# Patient Record
Sex: Female | Born: 1990 | Race: Black or African American | Hispanic: No | Marital: Single | Smoking: Former smoker
Health system: Southern US, Community
[De-identification: ages and names within clinical notes are randomized; demographics above are authoritative.]

## PROBLEM LIST (undated history)

## (undated) ENCOUNTER — Inpatient Hospital Stay (HOSPITAL_COMMUNITY): Payer: Self-pay

## (undated) DIAGNOSIS — D649 Anemia, unspecified: Secondary | ICD-10-CM

## (undated) DIAGNOSIS — A749 Chlamydial infection, unspecified: Secondary | ICD-10-CM

## (undated) HISTORY — PX: NO PAST SURGERIES: SHX2092

---
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
## 2011-09-27 ENCOUNTER — Inpatient Hospital Stay (HOSPITAL_COMMUNITY)
Admission: AD | Admit: 2011-09-27 | Discharge: 2011-09-30 | DRG: 775 | Disposition: A | Discharge status: Home / Self Care | Payer: Medicaid Other | Source: Ambulatory Visit | Attending: Obstetrics | Admitting: Obstetrics

## 2011-09-27 ENCOUNTER — Encounter (HOSPITAL_COMMUNITY): Payer: Self-pay | Admitting: *Deleted

## 2011-09-27 NOTE — Progress Notes (Signed)
Pt off monitor to BR and ambulate to rm 165

## 2011-09-27 NOTE — Progress Notes (Signed)
Pt states she has been contracting all evening and they are getting worse and worse

## 2011-09-28 ENCOUNTER — Encounter (HOSPITAL_COMMUNITY): Payer: Self-pay | Admitting: *Deleted

## 2011-09-28 ENCOUNTER — Encounter (HOSPITAL_COMMUNITY): Payer: Self-pay | Admitting: Anesthesiology

## 2011-09-28 ENCOUNTER — Inpatient Hospital Stay (HOSPITAL_COMMUNITY): Payer: Medicaid Other | Admitting: Anesthesiology

## 2011-09-28 LAB — ABO/RH: RH Type: POSITIVE

## 2011-09-28 LAB — CBC
HCT: 32.9 % — ABNORMAL LOW (ref 36.0–46.0)
Hemoglobin: 11.2 g/dL — ABNORMAL LOW (ref 12.0–15.0)
RBC: 3.75 MIL/uL — ABNORMAL LOW (ref 3.87–5.11)
WBC: 7.9 10*3/uL (ref 4.0–10.5)

## 2011-09-28 LAB — GC/CHLAMYDIA PROBE AMP, GENITAL
Chlamydia: NEGATIVE
Gonorrhea: NEGATIVE

## 2011-09-28 LAB — RPR: RPR: NONREACTIVE

## 2011-09-28 LAB — HIV ANTIBODY (ROUTINE TESTING W REFLEX): HIV: NONREACTIVE

## 2011-09-28 LAB — ANTIBODY SCREEN: Antibody Screen: NEGATIVE

## 2011-09-28 MED ORDER — IBUPROFEN 600 MG PO TABS
600.0000 mg | ORAL_TABLET | Freq: Four times a day (QID) | ORAL | Status: DC
  Administered 2011-09-28 – 2011-09-30 (×8): 600 mg via ORAL
  Filled 2011-09-28 (×7): qty 1

## 2011-09-28 MED ORDER — LIDOCAINE HCL 1.5 % IJ SOLN
INTRAMUSCULAR | Status: DC | PRN
  Administered 2011-09-28 (×2): 4 mL via EPIDURAL

## 2011-09-28 MED ORDER — SIMETHICONE 80 MG PO CHEW: 80.0000 mg | CHEWABLE_TABLET | ORAL | Status: DC | PRN

## 2011-09-28 MED ORDER — WITCH HAZEL-GLYCERIN EX PADS: 1.0000 "application " | MEDICATED_PAD | CUTANEOUS | Status: DC | PRN

## 2011-09-28 MED ORDER — PHENYLEPHRINE 40 MCG/ML (10ML) SYRINGE FOR IV PUSH (FOR BLOOD PRESSURE SUPPORT)
80.0000 ug | PREFILLED_SYRINGE | INTRAVENOUS | Status: DC | PRN
  Filled 2011-09-28: qty 5

## 2011-09-28 MED ORDER — SODIUM CHLORIDE 0.9 % IV SOLN
2.0000 g | Freq: Four times a day (QID) | INTRAVENOUS | Status: DC
  Administered 2011-09-28: 2 g via INTRAVENOUS
  Filled 2011-09-28 (×5): qty 2000

## 2011-09-28 MED ORDER — SODIUM CHLORIDE 0.9 % IV SOLN: 250.0000 mL | INTRAVENOUS | Status: DC | PRN

## 2011-09-28 MED ORDER — OXYTOCIN 20 UNITS IN LACTATED RINGERS INFUSION - SIMPLE
1.0000 m[IU]/min | INTRAVENOUS | Status: DC
Start: 2011-09-28 — End: 2011-09-28
  Administered 2011-09-28: 1 m[IU]/min via INTRAVENOUS

## 2011-09-28 MED ORDER — MEDROXYPROGESTERONE ACETATE 150 MG/ML IM SUSP: 150.0000 mg | INTRAMUSCULAR | Status: DC | PRN

## 2011-09-28 MED ORDER — DIPHENHYDRAMINE HCL 50 MG/ML IJ SOLN: 12.5000 mg | INTRAMUSCULAR | Status: DC | PRN

## 2011-09-28 MED ORDER — ONDANSETRON HCL 4 MG PO TABS: 4.0000 mg | ORAL_TABLET | ORAL | Status: DC | PRN

## 2011-09-28 MED ORDER — CITRIC ACID-SODIUM CITRATE 334-500 MG/5ML PO SOLN: 30.0000 mL | ORAL | Status: DC | PRN

## 2011-09-28 MED ORDER — SODIUM CHLORIDE 0.9 % IJ SOLN: 3.0000 mL | INTRAMUSCULAR | Status: DC | PRN

## 2011-09-28 MED ORDER — OXYCODONE-ACETAMINOPHEN 5-325 MG PO TABS
1.0000 | ORAL_TABLET | ORAL | Status: DC | PRN
Start: 2011-09-28 — End: 2011-09-30
  Administered 2011-09-29: 2 via ORAL
  Administered 2011-09-30: 1 via ORAL
  Filled 2011-09-28: qty 2
  Filled 2011-09-28: qty 1

## 2011-09-28 MED ORDER — OXYTOCIN 20 UNITS IN LACTATED RINGERS INFUSION - SIMPLE
125.0000 mL/h | Freq: Once | INTRAVENOUS | Status: AC
  Administered 2011-09-28: 999 mL/h via INTRAVENOUS

## 2011-09-28 MED ORDER — FLEET ENEMA 7-19 GM/118ML RE ENEM: 1.0000 | ENEMA | RECTAL | Status: DC | PRN

## 2011-09-28 MED ORDER — TERBUTALINE SULFATE 1 MG/ML IJ SOLN: 0.2500 mg | Freq: Once | INTRAMUSCULAR | Status: DC | PRN

## 2011-09-28 MED ORDER — IBUPROFEN 600 MG PO TABS: 600.0000 mg | ORAL_TABLET | Freq: Four times a day (QID) | ORAL | Status: DC | PRN

## 2011-09-28 MED ORDER — PRENATAL PLUS 27-1 MG PO TABS
1.0000 | ORAL_TABLET | Freq: Every day | ORAL | Status: DC
  Administered 2011-09-28 – 2011-09-30 (×3): 1 via ORAL
  Filled 2011-09-28 (×3): qty 1

## 2011-09-28 MED ORDER — FENTANYL 2.5 MCG/ML BUPIVACAINE 1/10 % EPIDURAL INFUSION (WH - ANES)
14.0000 mL/h | INTRAMUSCULAR | Status: DC
  Administered 2011-09-28: 14 mL/h via EPIDURAL
  Filled 2011-09-28 (×2): qty 60

## 2011-09-28 MED ORDER — EPHEDRINE 5 MG/ML INJ
10.0000 mg | INTRAVENOUS | Status: DC | PRN
  Filled 2011-09-28: qty 4

## 2011-09-28 MED ORDER — OXYTOCIN 20 UNITS IN LACTATED RINGERS INFUSION - SIMPLE
INTRAVENOUS | Status: AC
  Filled 2011-09-28: qty 1000

## 2011-09-28 MED ORDER — DIPHENHYDRAMINE HCL 25 MG PO CAPS: 25.0000 mg | ORAL_CAPSULE | Freq: Four times a day (QID) | ORAL | Status: DC | PRN

## 2011-09-28 MED ORDER — OXYTOCIN 20 UNITS IN LACTATED RINGERS INFUSION - SIMPLE: 125.0000 mL/h | INTRAVENOUS | Status: DC | PRN

## 2011-09-28 MED ORDER — SENNOSIDES-DOCUSATE SODIUM 8.6-50 MG PO TABS
2.0000 | ORAL_TABLET | Freq: Every day | ORAL | Status: DC
  Administered 2011-09-28 – 2011-09-29 (×2): 2 via ORAL

## 2011-09-28 MED ORDER — PHENYLEPHRINE 40 MCG/ML (10ML) SYRINGE FOR IV PUSH (FOR BLOOD PRESSURE SUPPORT): 80.0000 ug | PREFILLED_SYRINGE | INTRAVENOUS | Status: DC | PRN

## 2011-09-28 MED ORDER — ONDANSETRON HCL 4 MG/2ML IJ SOLN: 4.0000 mg | Freq: Four times a day (QID) | INTRAMUSCULAR | Status: DC | PRN

## 2011-09-28 MED ORDER — TETANUS-DIPHTH-ACELL PERTUSSIS 5-2.5-18.5 LF-MCG/0.5 IM SUSP: 0.5000 mL | Freq: Once | INTRAMUSCULAR | Status: DC

## 2011-09-28 MED ORDER — FENTANYL 2.5 MCG/ML BUPIVACAINE 1/10 % EPIDURAL INFUSION (WH - ANES)
INTRAMUSCULAR | Status: DC | PRN
  Administered 2011-09-28: 14 mL/h via EPIDURAL

## 2011-09-28 MED ORDER — LACTATED RINGERS IV SOLN
500.0000 mL | INTRAVENOUS | Status: DC | PRN
  Administered 2011-09-28: 1000 mL via INTRAVENOUS

## 2011-09-28 MED ORDER — LANOLIN HYDROUS EX OINT: TOPICAL_OINTMENT | CUTANEOUS | Status: DC | PRN

## 2011-09-28 MED ORDER — SODIUM CHLORIDE 0.9 % IJ SOLN: 3.0000 mL | Freq: Two times a day (BID) | INTRAMUSCULAR | Status: DC

## 2011-09-28 MED ORDER — BENZOCAINE-MENTHOL 20-0.5 % EX AERO
INHALATION_SPRAY | CUTANEOUS | Status: AC
  Administered 2011-09-28: 1 via TOPICAL
  Filled 2011-09-28: qty 56

## 2011-09-28 MED ORDER — LACTATED RINGERS IV SOLN
INTRAVENOUS | Status: DC
  Administered 2011-09-28: 14:00:00 via INTRAVENOUS

## 2011-09-28 MED ORDER — ONDANSETRON HCL 4 MG/2ML IJ SOLN: 4.0000 mg | INTRAMUSCULAR | Status: DC | PRN

## 2011-09-28 MED ORDER — OXYCODONE-ACETAMINOPHEN 5-325 MG PO TABS: 2.0000 | ORAL_TABLET | ORAL | Status: DC | PRN

## 2011-09-28 MED ORDER — EPHEDRINE 5 MG/ML INJ: 10.0000 mg | INTRAVENOUS | Status: DC | PRN

## 2011-09-28 MED ORDER — BUTORPHANOL TARTRATE 2 MG/ML IJ SOLN
1.0000 mg | INTRAMUSCULAR | Status: DC | PRN
  Administered 2011-09-28 (×2): 1 mg via INTRAVENOUS
  Filled 2011-09-28 (×2): qty 1

## 2011-09-28 MED ORDER — DIBUCAINE 1 % RE OINT: 1.0000 "application " | TOPICAL_OINTMENT | RECTAL | Status: DC | PRN

## 2011-09-28 MED ORDER — LIDOCAINE HCL (PF) 1 % IJ SOLN
30.0000 mL | INTRAMUSCULAR | Status: DC | PRN
  Filled 2011-09-28: qty 30

## 2011-09-28 MED ORDER — LACTATED RINGERS IV SOLN: 500.0000 mL | Freq: Once | INTRAVENOUS | Status: DC

## 2011-09-28 MED ORDER — ACETAMINOPHEN 325 MG PO TABS: 650.0000 mg | ORAL_TABLET | ORAL | Status: DC | PRN

## 2011-09-28 MED ORDER — ZOLPIDEM TARTRATE 5 MG PO TABS: 5.0000 mg | ORAL_TABLET | Freq: Every evening | ORAL | Status: DC | PRN

## 2011-09-28 MED ORDER — BENZOCAINE-MENTHOL 20-0.5 % EX AERO
1.0000 "application " | INHALATION_SPRAY | CUTANEOUS | Status: DC | PRN
  Administered 2011-09-28: 1 via TOPICAL

## 2011-09-28 MED ORDER — OXYTOCIN BOLUS FROM INFUSION
500.0000 mL | Freq: Once | INTRAVENOUS | Status: DC
  Filled 2011-09-28: qty 500

## 2011-09-28 NOTE — Progress Notes (Signed)
Allison Long is a 20 y.o. G1P0000 at [redacted]w[redacted]d by LMP admitted for active labor  Subjective: Pt comfortable with epidural.   Objective: BP 103/77  Pulse 95  Temp(Src) 98.5 F (36.9 C) (Oral)  Resp 20  Ht 5' (1.524 m)  Wt 69.514 kg (153 lb 4 oz)  BMI 29.93 kg/m2  SpO2 100%   Total I/O In: -  Out: 200 [Urine:200]  FHT:  FHR: 150 bpm, variability: moderate,  accelerations:  Present,  decelerations:  Present Occasional variable and early decels noted.  UC:   toco not tracing contractions well. q2-47mins, Pitocin at 1 mu/min SVE:   Dilation: Lip/rim Effacement (%): 100 Station: +1 Exam by:: Dr. Gaynell Face (exam by Alvino Blood CNM).  Labs: Lab Results  Component Value Date   WBC 7.9 09/28/2011   HGB 11.2* 09/28/2011   HCT 32.9* 09/28/2011   MCV 87.7 09/28/2011   PLT 280 09/28/2011    Assessment / Plan: Augmentation of labor, progressing well, IUPC inserted with ease. Consult with MD prn.   Labor: Progressing normally Preeclampsia:  no signs or symptoms of toxicity Fetal Wellbeing:  Category II Pain Control:  Epidural I/D:  n/a Anticipated MOD:  NSVD  Anice Paganini CNM 09/28/2011, 12:44 PM

## 2011-09-28 NOTE — Progress Notes (Signed)
Post Partum Day 0 Subjective: Pt delivered precipitously while vomiting, faculty practice MDs in attendance for placenta and repair.   Objective: Blood pressure 132/69, pulse 65, temperature 98.1 F (36.7 C), temperature source Oral, resp. rate 20, height 5' (1.524 m), weight 69.514 kg (153 lb 4 oz), SpO2 100.00%, unknown if currently breastfeeding.  Physical Exam:  General: alert, cooperative, appears stated age and no distress Lochia: appropriate Uterine Fundus: firm Incision: lacerations repaired, no hematoma, edema or erythema.  DVT Evaluation: No cords or calf tenderness. No significant calf/ankle edema.   Basename 09/28/11 0050  HGB 11.2*  HCT 32.9*    Assessment/Plan: Transfer to postpartum, stable condition.    LOS: 1 day   Anice Paganini CNM 09/28/2011, 3:03 PM

## 2011-09-28 NOTE — Progress Notes (Signed)
Patient ID: Allison Long, female   DOB: July 11, 1991, 20 y.o.   MRN: [id number]  Called by nursing staff to respond to a precipitous delivery.  The infant was born, cord clamped and cut by the time I arrived.  Placenta delivered with fundal massage.  First degree periclitoral and right and left labial lacerations repaired with 4-0 Vicryl, interrupted stitches.    EBL approximately 500 mL. Dr Macon Large was present for the repair.    STINSON, JACOB JEHIEL 09/28/2011 3:00 PM

## 2011-09-28 NOTE — Progress Notes (Signed)
Allison Long is a 20 y.o. G1P0000 at [redacted]w[redacted]d admitted for active labor  Subjective: Comfortable.  Objective: BP 115/75  Pulse 79  Temp(Src) 98.5 F (36.9 C) (Oral)  Resp 18  Ht 5' (1.524 m)  Wt 69.514 kg (153 lb 4 oz)  BMI 29.93 kg/m2  SpO2 100%   Total I/O In: -  Out: 200 [Urine:200]  FHT:  FHR: 155 bpm, variability: moderate,  accelerations:  Abscent,  decelerations:  Present earlies and variables UC:   regular, every 1-5 minutes, some coupling. Pitocin at 38mu/min SVE:   Dilation: 10 Effacement (%): 100 Station: +2 Exam by:: Anice Paganini, CNM  Labs: Lab Results  Component Value Date   WBC 7.9 09/28/2011   HGB 11.2* 09/28/2011   HCT 32.9* 09/28/2011   MCV 87.7 09/28/2011   PLT 280 09/28/2011    Assessment / Plan: Spontaneous labor, progressing normally, will begin pushing with urge.   Labor: Progressing normally Preeclampsia:  no signs or symptoms of toxicity Fetal Wellbeing:  Category II Pain Control:  Epidural I/D:  n/a Anticipated MOD:  NSVD  Anice Paganini CNM 09/28/2011, 1:21 PM

## 2011-09-28 NOTE — H&P (Signed)
This is Dr. Francoise Ceo dictating the history and physical on  Allison Long she is a 20 year old gravida 1 at 4 weeks and 5 days her EDC was 09/30/2011 her GBS was unknown she was admitted in labor 5 cm 100% she'll go 7 cm 100% but still station amniotomy was performed the fluids clear and she is contracting regular basis Her past medical history was negative Past medical and surgical history was negative Social history negative System review negative Physical exam well-developed female in labor HEENT negative Breasts negative Heart regular rhythm no murmurs no gallops Abdomen term Extremities negative

## 2011-09-28 NOTE — Consult Note (Signed)
Neonatology Note:  Attendance at Code Apgar:  Our team responded to a Code Apgar call to room # 165 following a precipitous vaginal delivery, due to infant with apnea. The mother is a G1P0 O pos, GBS unknown with onset of labor early this morning. ROM occurred about 12 hours PTD and the fluid was clear. The mother received Ampicillin > 4 hours PTD. There had been some variable FHR decelerations during labor. The mother turned in the bed to vomit and the baby delivered precipitously. The baby was apneic. The OB nursing staff in attendance noted the baby's HR was 60-80. They gave vigorous stimulation and got no response, so a Code Apgar was called and they started PPV, which was given for 1 1/2 minutes. The HR responded quickly to PPV. Our team arrived at about 2 1/2 minutes of life, at which time the baby was still dusky, glassy-eyed and staring, but HR was rapid, PPV was being done, and the baby was starting to breathe on his own. PPV was discontinued and BBO2 was given for another 5 minutes. Tone and color improved gradually. The O2 saturation at 6 minutes of life was about 88%, adequate for age. Lungs had a few coarse breath sounds, but air exchange was equal. Apgar scores at 5 and 10 minutes were 7 and 9. I spoke with the parents in the DR. They held the baby for a few minutes, then we took him to the CN to continue transitioning under close observation. Transferred the baby to the Pediatrician's care. C. Rexann Lueras, MD  

## 2011-09-28 NOTE — Plan of Care (Signed)
Problem: Consults Goal: Birthing Suites Patient Information Press F2 to bring up selections list Outcome: Completed/Met Date Met:  09/28/11  Pt 37-[redacted] weeks EGA

## 2011-09-28 NOTE — Progress Notes (Signed)
Pt was sleeping while 10 cms (no urge to push).  CNM was delivering another pt. Pt suddenly awakened to say, "I have to vomit."  She began to vomit, and with the first heave, baby delivered precipitously at 1430 with this RN delivering.  No nuchal cord noted, cord not tight or around any body parts. Cord clamped and cut.  Infant opened eyes with tactile stimulation, but no breathing movements. Infant started to grimace, but no crying efforts made. Infant taken immediately to warmer for continued stimulation with dry warm blankets and assessment of hr, which was 70 bpm. No breathing noted. Code Apgar called. Started positive pressure ventilation with continued assessment of heart rate, tone, reflex, and color. Infant's heart rate immediately increased to over 120 bpm with positive pressure ventilation, at which time the team arrived. Gave team full report, Faculty [facility] already notified to come assess pt, deliver placenta and assess for repairs needed while pt's CNM with another pt.

## 2011-09-28 NOTE — Anesthesia Procedure Notes (Signed)
Epidural Patient location during procedure: OB Start time: 09/28/2011 8:32 AM  Staffing Anesthesiologist: Ladamien Rammel A. Performed by: anesthesiologist   Preanesthetic Checklist Completed: patient identified, site marked, surgical consent, pre-op evaluation, timeout performed, IV checked, risks and benefits discussed and monitors and equipment checked  Epidural Patient position: sitting Prep: site prepped and draped and DuraPrep Patient monitoring: continuous pulse ox and blood pressure Approach: midline Injection technique: LOR air  Needle:  Needle type: Tuohy  Needle gauge: 17 G Needle length: 9 cm Needle insertion depth: 4 cm Catheter type: closed end flexible Catheter size: 19 Gauge Catheter at skin depth: 9 cm Test dose: negative and 1.5% lidocaine  Assessment Events: blood not aspirated, injection not painful, no injection resistance, negative IV test and no paresthesia  Additional Notes Patient is more comfortable after epidural dosed. Please see RN's note for documentation of vital signs and FHR which are stable.

## 2011-09-28 NOTE — Anesthesia Preprocedure Evaluation (Addendum)
Anesthesia Evaluation  Patient identified by MRN, date of birth, ID band Patient awake    Reviewed: Allergy & Precautions, H&P , Patient's Chart, lab work & pertinent test results  Airway Mallampati: III TM Distance: >3 FB Neck ROM: full    Dental No notable dental hx. (+) Teeth Intact   Pulmonary neg pulmonary ROS,  clear to auscultation  Pulmonary exam normal       Cardiovascular neg cardio ROS regular Normal    Neuro/Psych Negative Neurological ROS  Negative Psych ROS   GI/Hepatic negative GI ROS, Neg liver ROS,   Endo/Other  Negative Endocrine ROS  Renal/GU negative Renal ROS  Genitourinary negative   Musculoskeletal negative musculoskeletal ROS (+)   Abdominal Normal abdominal exam  (+)   Peds  Hematology negative hematology ROS (+)   Anesthesia Other Findings   Reproductive/Obstetrics (+) Pregnancy                          Anesthesia Physical Anesthesia Plan  ASA: II  Anesthesia Plan: Epidural   Post-op Pain Management:    Induction:   Airway Management Planned:   Additional Equipment:   Intra-op Plan:   Post-operative Plan:   Informed Consent: I have reviewed the patients History and Physical, chart, labs and discussed the procedure including the risks, benefits and alternatives for the proposed anesthesia with the patient or authorized representative who has indicated his/her understanding and acceptance.     Plan Discussed with: Anesthesiologist and Surgeon  Anesthesia Plan Comments:         Anesthesia Quick Evaluation

## 2011-09-29 LAB — CBC
HCT: 26.3 % — ABNORMAL LOW (ref 36.0–46.0)
Hemoglobin: 9 g/dL — ABNORMAL LOW (ref 12.0–15.0)
RBC: 3.01 MIL/uL — ABNORMAL LOW (ref 3.87–5.11)
RDW: 13.2 % (ref 11.5–15.5)
WBC: 15.7 10*3/uL — ABNORMAL HIGH (ref 4.0–10.5)

## 2011-09-29 NOTE — Progress Notes (Signed)
Patient ID: Fredia Beets, female   DOB: 1990-12-14, 20 y.o.   MRN: [id number] Post Partum Day 1 S/P spontaneous vaginal RH status/Rubella reviewed.  Feeding: breast Subjective: No HA, SOB, CP, F/C, breast symptoms. Normal vaginal bleeding, no clots.     Objective: BP 111/73  Pulse 75  Temp(Src) 97.5 F (36.4 C) (Oral)  Resp 18  Ht 5' (1.524 m)  Wt 69.514 kg (153 lb 4 oz)  BMI 29.93 kg/m2  SpO2 100%  Breastfeeding? Unknown   Physical Exam:  General: alert Lochia: appropriate Uterine Fundus: firm DVT Evaluation: No evidence of DVT seen on physical exam. Ext: No c/c/e  Basename 09/29/11 0525 09/28/11 0050  HGB 9.0* 11.2*  HCT 26.3* 32.9*      Assessment/Plan: 20 y.o.  PPD #1 .  normal postpartum exam Continue current postpartum care  Ambulate   LOS: 2 days   JACKSON-MOORE,Amonte Brookover A 09/29/2011, 10:32 AM

## 2011-09-29 NOTE — Anesthesia Postprocedure Evaluation (Signed)
Anesthesia Post Note  Patient: Allison Long  Procedure(s) Performed: * No procedures listed *  Anesthesia type: Epidural  Patient location: Mother/Baby  Post pain: Pain level controlled  Post assessment: Post-op Vital signs reviewed  Last Vitals:  Filed Vitals:   09/29/11 0600  BP: 111/73  Pulse: 75  Temp: 36.4 C  Resp: 18    Post vital signs: Reviewed  Level of consciousness: awake  Complications: No apparent anesthesia complications

## 2011-09-30 MED ORDER — FERROUS SULFATE 325 (65 FE) MG PO TABS: 325.0000 mg | ORAL_TABLET | Freq: Two times a day (BID) | ORAL | Status: DC

## 2011-09-30 MED ORDER — IBUPROFEN 600 MG PO TABS: 600.0000 mg | ORAL_TABLET | Freq: Four times a day (QID) | ORAL | Status: AC

## 2011-09-30 NOTE — Progress Notes (Signed)
Post Partum Day #2 S/P:spontaneous vaginal  RH status/Rubella reviewed.  Feeding: breast Subjective: No HA, SOB, CP, F/C, breast symptoms: no. Normal vaginal bleeding, no clots.     Objective:  Blood pressure 100/63, pulse 76, temperature 98.3 F (36.8 C), temperature source Oral, resp. rate 18, height 5' (1.524 m), weight 69.514 kg (153 lb 4 oz), SpO2 94.00%, unknown if currently breastfeeding.   Physical Exam:  General: alert Lochia: appropriate Uterine Fundus: firm DVT Evaluation: No evidence of DVT seen on physical exam. Ext: No c/c/e  Basename 09/29/11 0525 09/28/11 0050  HGB 9.0* 11.2*  HCT 26.3* 32.9*    Assessment/Plan: 20 y.o.  PPD # 2 .  normal postpartum exam patient is a candidate for Depo-Provera for contraception, with no contraindications Continue current postpartum care D/C home   LOS: 3 days   JACKSON-MOORE,Jalisha Enneking A 09/30/2011, 11:05 AM

## 2011-09-30 NOTE — Discharge Summary (Signed)
  Obstetric Discharge Summary Reason for Admission: onset of labor Prenatal Procedures: none Intrapartum Procedures: spontaneous vaginal delivery Postpartum Procedures: none Complications-Operative and Postpartum: none  Hemoglobin  Date Value Range Status  09/29/2011 9.0* 12.0-15.0 (g/dL) Final     REPEATED TO VERIFY     DELTA CHECK NOTED     HCT  Date Value Range Status  09/29/2011 26.3* 36.0-46.0 (%) Final    Discharge Diagnoses: Term Pregnancy-delivered  Discharge Information: Date: 09/30/2011 Activity: pelvic rest Diet: routine Medications:  Prior to Admission medications   Medication Sig Start Date End Date Taking? Authorizing Provider  acetaminophen (TYLENOL) 500 MG tablet Take 1,000 mg by mouth every 6 (six) hours as needed. pain    Yes Historical Provider, MD  ferrous sulfate 325 (65 FE) MG tablet Take 1 tablet (325 mg total) by mouth 2 (two) times daily before Long meal. 09/30/11 09/29/12  Roseanna Rainbow, MD  ibuprofen (ADVIL,MOTRIN) 600 MG tablet Take 1 tablet (600 mg total) by mouth every 6 (six) hours. 09/30/11 10/10/11  Roseanna Rainbow, MD  prenatal vitamin w/FE, FA (PRENATAL 1 + 1) 27-1 MG TABS Take 1 tablet by mouth daily.     Yes Historical Provider, MD    Condition: stable Instructions: refer to routine discharge instructions Discharge to: home Follow-up Information    Follow up with MARSHALL,BERNARD A, MD. Make an appointment in 6 weeks.   Contact information:   [address] [place] [address] [phone]          Newborn Data: Live born female.  Home with mother.  Allison Long,Allison Long 09/30/2011, 11:11 AM

## 2011-10-01 NOTE — Progress Notes (Signed)
UR chart review completed.  

## 2012-05-06 ENCOUNTER — Encounter (HOSPITAL_COMMUNITY): Payer: Self-pay

## 2012-05-06 ENCOUNTER — Emergency Department (HOSPITAL_COMMUNITY)
Admission: EM | Admit: 2012-05-06 | Discharge: 2012-05-06 | Disposition: A | Discharge status: Home / Self Care | Payer: Self-pay | Attending: Emergency Medicine | Admitting: Emergency Medicine

## 2012-05-06 DIAGNOSIS — B3731 Acute candidiasis of vulva and vagina: Secondary | ICD-10-CM | POA: Insufficient documentation

## 2012-05-06 DIAGNOSIS — B373 Candidiasis of vulva and vagina: Secondary | ICD-10-CM | POA: Insufficient documentation

## 2012-05-06 LAB — URINALYSIS, ROUTINE W REFLEX MICROSCOPIC
Bilirubin Urine: NEGATIVE
Glucose, UA: NEGATIVE mg/dL
Hgb urine dipstick: NEGATIVE
Ketones, ur: NEGATIVE mg/dL
Nitrite: NEGATIVE
Protein, ur: NEGATIVE mg/dL
Specific Gravity, Urine: 1.03 (ref 1.005–1.030)
Urobilinogen, UA: 0.2 mg/dL (ref 0.0–1.0)
pH: 6 (ref 5.0–8.0)

## 2012-05-06 LAB — WET PREP, GENITAL: Trich, Wet Prep: NONE SEEN

## 2012-05-06 LAB — POCT PREGNANCY, URINE: Preg Test, Ur: NEGATIVE

## 2012-05-06 LAB — URINE MICROSCOPIC-ADD ON

## 2012-05-06 MED ORDER — FLUCONAZOLE 200 MG PO TABS: 200.0000 mg | ORAL_TABLET | Freq: Once | ORAL | Status: AC

## 2012-05-06 NOTE — ED Notes (Signed)
Pt. Tolerated pelvic exam without difficulty

## 2012-05-06 NOTE — ED Provider Notes (Signed)
History     CSN: [id number]  Arrival date & time 05/06/12  1419   First MD Initiated Contact with Patient 05/06/12 1508      Chief Complaint  Patient presents with  . Vaginitis    (Consider location/radiation/quality/duration/timing/severity/associated sxs/prior treatment) Patient is a 21 y.o. female presenting with vaginal itching. The history is provided by the patient.  Vaginal Itching This is a new problem. The current episode started in the past 7 days. The problem occurs constantly. Progression since onset: today she noticed vaginal discharge. Pertinent negatives include no abdominal pain, fever, nausea or vomiting. Associated symptoms comments: Vaginal irritation for 3-4 days. No pelvic pain or fever. She reports vaginal/vulvar irritation with urination, otherwise no dysuria. . Treatments tried: She has tried over-the-counter Monostat without relief.    History reviewed. No pertinent past medical history.  History reviewed. No pertinent past surgical history.  History reviewed. No pertinent family history.  History  Substance Use Topics  . Smoking status: Not on file  . Smokeless tobacco: Not on file  . Alcohol Use: Yes    OB History    Grav Para Term Preterm Abortions TAB SAB Ect Mult Living   1 1 1  0 0 0 0 0 0 1      Review of Systems  Constitutional: Negative for fever.  Gastrointestinal: Negative for nausea, vomiting and abdominal pain.  Genitourinary: Positive for vaginal discharge and vaginal pain. Negative for dysuria, vaginal bleeding and menstrual problem.  Musculoskeletal: Negative for back pain.    Allergies  Review of patient's allergies indicates no known allergies.  Home Medications   Current Outpatient Rx  Name Route Sig Dispense Refill  . ACETAMINOPHEN 500 MG PO TABS Oral Take 1,000 mg by mouth every 6 (six) hours as needed. pain     . FERROUS SULFATE 325 (65 FE) MG PO TABS Oral Take 325 mg by mouth daily with breakfast.    . MICONAZOLE  NITRATE 2 % VA CREA Vaginal Place 1 applicator vaginally at bedtime.      BP 108/57  Pulse 82  Temp 98.5 F (36.9 C) (Oral)  Resp 18  SpO2 100%  Breastfeeding? Unknown  Physical Exam  Constitutional: She appears well-developed and well-nourished.  HENT:  Head: Normocephalic.  Neck: Normal range of motion. Neck supple.  Cardiovascular: Normal rate and regular rhythm.   Pulmonary/Chest: Effort normal and breath sounds normal.  Abdominal: Soft. Bowel sounds are normal. There is no tenderness. There is no rebound and no guarding.  Genitourinary: Uterus normal. Vaginal discharge found.       No adnexal mass or tenderness. White, thick vaginal discharge. No CMT. External vaginal area without rash, mass or discoloration.  Musculoskeletal: Normal range of motion.  Neurological: She is alert.  Skin: Skin is warm and dry. No rash noted.  Psychiatric: She has a normal mood and affect.    ED Course  Procedures (including critical care time)   Labs Reviewed  URINALYSIS, ROUTINE W REFLEX MICROSCOPIC  WET PREP, GENITAL  GC/CHLAMYDIA PROBE AMP, GENITAL   No results found. Results for orders placed during the hospital encounter of 05/06/12  URINALYSIS, ROUTINE W REFLEX MICROSCOPIC      Component Value Range   Color, Urine YELLOW  YELLOW   APPearance HAZY (*) CLEAR   Specific Gravity, Urine 1.030  1.005 - 1.030   pH 6.0  5.0 - 8.0   Glucose, UA NEGATIVE  NEGATIVE mg/dL   Hgb urine dipstick NEGATIVE  NEGATIVE   Bilirubin  Urine NEGATIVE  NEGATIVE   Ketones, ur NEGATIVE  NEGATIVE mg/dL   Protein, ur NEGATIVE  NEGATIVE mg/dL   Urobilinogen, UA 0.2  0.0 - 1.0 mg/dL   Nitrite NEGATIVE  NEGATIVE   Leukocytes, UA MODERATE (*) NEGATIVE  WET PREP, GENITAL      Component Value Range   Yeast Wet Prep HPF POC MODERATE (*) NONE SEEN   Trich, Wet Prep NONE SEEN  NONE SEEN   Clue Cells Wet Prep HPF POC FEW (*) NONE SEEN   WBC, Wet Prep HPF POC MODERATE (*) NONE SEEN  POCT PREGNANCY, URINE       Component Value Range   Preg Test, Ur NEGATIVE  NEGATIVE  URINE MICROSCOPIC-ADD ON      Component Value Range   Squamous Epithelial / LPF MANY (*) RARE   WBC, UA 7-10  <3 WBC/hpf   Bacteria, UA MANY (*) RARE   Urine-Other MUCOUS PRESENT       No diagnosis found. 1. Vaginal yeast    MDM  WBC's found on wet prep and urine. No dysuria, frequency. Favor inflammatory changes associated with vaginal yeast infection rather than UTI. No pelvic tenderness, CMT or purulent appearing discharge to support PID. Will treat yeast and follow up with cultures of cervix and urine.         Rodena Medin, PA-C 05/06/12 1610

## 2012-05-06 NOTE — ED Notes (Signed)
Pt complains of vaginal irritation

## 2012-05-07 LAB — URINE CULTURE: Colony Count: 40000

## 2012-05-07 NOTE — ED Provider Notes (Signed)
Medical screening examination/treatment/procedure(s) were performed by non-physician practitioner and as supervising physician I was immediately available for consultation/collaboration.   Gavin Pound. Oletta Lamas, MD 05/07/12 2148

## 2012-05-09 NOTE — ED Notes (Signed)
+  Gonorrhea and +Chlamydia. Chart sent to EDP office for review. Chart returned from EDP office. "Pt. Either needs to come back or go to health dept for tx (rocephin 250 mg IM, Zithromax 1 gram PO) OR can call in 2 grams Zithromax (take 8 tabs of 250 mg once)-most people can't tolerate the big dose though so would recommend returning for tx." Prescribed/reviewed by Grant Fontana PA-C.

## 2012-05-09 NOTE — ED Notes (Signed)
Attempted to contact patient. No answer. Left voicemail for patient to call back. °

## 2012-05-10 NOTE — ED Notes (Signed)
Message left for patient to call back

## 2012-05-11 NOTE — ED Notes (Signed)
Voice mail message left for patient to return call. 

## 2012-05-12 NOTE — ED Notes (Signed)
Sent letter after no answer x 3. °

## 2012-06-09 NOTE — ED Notes (Signed)
No response after 30 days. Chart appended and sent to Medical Records. 

## 2013-09-17 ENCOUNTER — Encounter (HOSPITAL_COMMUNITY): Payer: Self-pay | Admitting: Family Medicine

## 2013-09-17 ENCOUNTER — Other Ambulatory Visit (HOSPITAL_COMMUNITY)
Admission: RE | Admit: 2013-09-17 | Discharge: 2013-09-17 | Disposition: A | Discharge status: Home / Self Care | Payer: Medicaid Other | Source: Ambulatory Visit | Attending: Family Medicine | Admitting: Family Medicine

## 2013-09-17 ENCOUNTER — Emergency Department (INDEPENDENT_AMBULATORY_CARE_PROVIDER_SITE_OTHER)
Admission: EM | Admit: 2013-09-17 | Discharge: 2013-09-17 | Disposition: A | Discharge status: Home / Self Care | Payer: Self-pay | Source: Home / Self Care

## 2013-09-17 DIAGNOSIS — A499 Bacterial infection, unspecified: Secondary | ICD-10-CM

## 2013-09-17 DIAGNOSIS — Z113 Encounter for screening for infections with a predominantly sexual mode of transmission: Secondary | ICD-10-CM | POA: Insufficient documentation

## 2013-09-17 DIAGNOSIS — B9689 Other specified bacterial agents as the cause of diseases classified elsewhere: Secondary | ICD-10-CM

## 2013-09-17 DIAGNOSIS — N760 Acute vaginitis: Secondary | ICD-10-CM

## 2013-09-17 HISTORY — DX: Chlamydial infection, unspecified: A74.9

## 2013-09-17 LAB — POCT URINALYSIS DIP (DEVICE)
Bilirubin Urine: NEGATIVE
Glucose, UA: NEGATIVE mg/dL
Hgb urine dipstick: NEGATIVE
Ketones, ur: NEGATIVE mg/dL
Nitrite: NEGATIVE
Specific Gravity, Urine: 1.02 (ref 1.005–1.030)
pH: 7 (ref 5.0–8.0)

## 2013-09-17 MED ORDER — METRONIDAZOLE 500 MG PO TABS: 500.0000 mg | ORAL_TABLET | Freq: Two times a day (BID) | ORAL | Status: DC

## 2013-09-17 NOTE — ED Provider Notes (Signed)
Medical screening examination/treatment/procedure(s) were performed by resident physician or non-physician practitioner and as supervising physician I was immediately available for consultation/collaboration.   Barkley Bruns MD.   Linna Hoff, MD 09/17/13 2117

## 2013-09-17 NOTE — ED Notes (Signed)
Vaginal discharge onset mid November.  States white and creamy and has an odor.

## 2013-09-17 NOTE — ED Provider Notes (Signed)
CSN: [id number]     Arrival date & time 09/17/13  1637 History   None    No chief complaint on file.  (Consider location/radiation/quality/duration/timing/severity/associated sxs/prior Treatment) Patient is a 22 y.o. female presenting with vaginal discharge.  Vaginal Discharge Quality:  Thin and white Severity:  Moderate Onset quality:  Gradual Duration:  3 weeks Timing:  Constant Progression:  Unchanged Chronicity:  New Relieved by:  None tried Worsened by:  Nothing tried Associated symptoms: no abdominal pain, no dyspareunia, no dysuria, no fever, no genital lesions, no nausea, no rash, no urinary frequency and no vaginal itching   Risk factors: unprotected sex   Risk factors: no new sexual partner and no prior miscarriage   Same sexual partner for 3 years Ms  Past Medical History  Diagnosis Date  . Chlamydia     x 2   No past surgical history on file. No family history on file. History  Substance Use Topics  . Smoking status: Not on file  . Smokeless tobacco: Not on file  . Alcohol Use: Yes   OB History   Grav Para Term Preterm Abortions TAB SAB Ect Mult Living   1 1 1  0 0 0 0 0 0 1     Review of Systems  Constitutional: Negative for fever.  Gastrointestinal: Negative for nausea and abdominal pain.  Genitourinary: Positive for vaginal discharge. Negative for dysuria and dyspareunia.    Allergies  Review of patient's allergies indicates no known allergies.  Home Medications   Current Outpatient Rx  Name  Route  Sig  Dispense  Refill  . acetaminophen (TYLENOL) 500 MG tablet   Oral   Take 1,000 mg by mouth every 6 (six) hours as needed. pain          . ferrous sulfate 325 (65 FE) MG tablet   Oral   Take 325 mg by mouth daily with breakfast.         . metroNIDAZOLE (FLAGYL) 500 MG tablet   Oral   Take 1 tablet (500 mg total) by mouth 2 (two) times daily.   14 tablet   0   . miconazole (MONISTAT 7) 2 % vaginal cream   Vaginal   Place 1 applicator  vaginally at bedtime.          BP 114/95  Pulse 68  Temp(Src) 98.5 F (36.9 C) (Oral)  Resp 14  SpO2 100% Physical Exam  Constitutional: She appears well-developed and well-nourished. No distress.  Abdominal: Soft. Bowel sounds are normal. She exhibits no distension and no mass. There is no tenderness. There is no rebound.  Genitourinary: Pelvic exam was performed with patient prone. There is no rash, tenderness or lesion on the right labia. There is no rash, tenderness or lesion on the left labia. Cervix exhibits no motion tenderness. Right adnexum displays no tenderness. Left adnexum displays no tenderness. Vaginal discharge found.  Thin gray discharge  Skin: Skin is warm and dry. No rash noted. She is not diaphoretic.    ED Course  Procedures (including critical care time) Labs Review Labs Reviewed  POCT URINALYSIS DIP (DEVICE)  POCT PREGNANCY, URINE  CERVICOVAGINAL ANCILLARY ONLY   Imaging Review No results found.    MDM   1. Bacterial vaginosis    Thin gray discharge like BV. Will treat with flagyl presumptively and check for GC/Chlamydia/Trich/BV/Yeast. No evidence of PID.    Garnetta Buddy, MD 09/17/13 [phone]

## 2013-09-21 ENCOUNTER — Telehealth: Payer: Self-pay | Admitting: Family Medicine

## 2013-09-21 DIAGNOSIS — A749 Chlamydial infection, unspecified: Secondary | ICD-10-CM

## 2013-09-21 MED ORDER — AZITHROMYCIN 250 MG PO TABS: 1000.0000 mg | ORAL_TABLET | Freq: Once | ORAL | Status: DC

## 2013-09-21 NOTE — Telephone Encounter (Signed)
After attempting the patient's number, I received a message that it was disconnected. I called her mother who gave me two numbers to try: [phone] and [phone]. I called [phone] and another woman answered the phone and placed me on hold for 5 minutes. Therefore, I hung up. I sent a prescription for azithromycin 1 g to the pharmacy. Her partner also needs to be treated. I will forward this information to the RN to contact the patient.

## 2013-09-21 NOTE — ED Notes (Addendum)
12/7 GC neg., Chlamydia pos.,  Affirm: Candida and Trich neg., Gardnerella pos. Pt. adequately treated with Flagyl.  Message sent to Dr. Clinton Sawyer.  12/8  Dr. Clinton Sawyer e-prescribed Zithromax to [facility] on [address]. and attempted to call pt. I tried again. I left message with a contact for pt. to call. Call 2. Allison Long 12/9 Pt. called back.  Pt. verified x 2 and given results.  Pt. told was adequately treated with Flagyl for bacterial vaginosis and needs Zithromax for Chlamydia. Pt. told where to pick up her Rx.  I advised pt. to take medication with food and call back if she vomits the medication up.  Pt. instructed to notify her partner, no sex for 1 week and to practice safe sex. Pt. told she can get HIV testing at the [facility] STD clinic.

## 2013-10-12 NOTE — ED Notes (Signed)
Late note 12/9  DHHS form faxed to the [facility] Vassie Moselle 10/12/2013

## 2013-10-15 NOTE — L&D Delivery Note (Signed)
Delivery Note At 4:07 PM a viable female was delivered via  (Presentation: ;  ).  APGAR: , ; weight  .   Placenta status: , .  Cord:  with the following complications: .  Cord pH: not done  Anesthesia:   Episiotomy:   Lacerations:   Suture Repair: 2.0 Est. Blood Loss (mL):    Mom to postpartum.  Baby to Couplet care / Skin to Skin.  MARSHALL,BERNARD A 08/19/2014, 4:22 PM

## 2014-03-10 LAB — OB RESULTS CONSOLE RPR: RPR: NONREACTIVE

## 2014-03-10 LAB — OB RESULTS CONSOLE HIV ANTIBODY (ROUTINE TESTING): HIV: NONREACTIVE

## 2014-03-10 LAB — PROCEDURE REPORT - SCANNED: PAP SMEAR: NEGATIVE

## 2014-03-10 LAB — OB RESULTS CONSOLE RUBELLA ANTIBODY, IGM: Rubella: IMMUNE

## 2014-03-10 LAB — OB RESULTS CONSOLE ANTIBODY SCREEN: Antibody Screen: NEGATIVE

## 2014-03-10 LAB — OB RESULTS CONSOLE GC/CHLAMYDIA
CHLAMYDIA, DNA PROBE: NEGATIVE
Gonorrhea: NEGATIVE

## 2014-03-10 LAB — OB RESULTS CONSOLE ABO/RH: RH Type: POSITIVE

## 2014-03-10 LAB — OB RESULTS CONSOLE HEPATITIS B SURFACE ANTIGEN: Hepatitis B Surface Ag: NEGATIVE

## 2014-08-09 LAB — OB RESULTS CONSOLE GBS: GBS: NEGATIVE

## 2014-08-16 ENCOUNTER — Encounter (HOSPITAL_COMMUNITY): Payer: Self-pay | Admitting: Family Medicine

## 2014-08-19 ENCOUNTER — Encounter (HOSPITAL_COMMUNITY): Payer: Self-pay

## 2014-08-19 ENCOUNTER — Inpatient Hospital Stay (HOSPITAL_COMMUNITY): Payer: Medicaid Other | Admitting: Anesthesiology

## 2014-08-19 ENCOUNTER — Inpatient Hospital Stay (HOSPITAL_COMMUNITY)
Admission: AD | Admit: 2014-08-19 | Discharge: 2014-08-21 | DRG: 775 | Disposition: A | Discharge status: Home / Self Care | Payer: Medicaid Other | Source: Ambulatory Visit | Attending: Obstetrics | Admitting: Obstetrics

## 2014-08-19 DIAGNOSIS — Z3A39 39 weeks gestation of pregnancy: Secondary | ICD-10-CM | POA: Diagnosis present

## 2014-08-19 DIAGNOSIS — O471 False labor at or after 37 completed weeks of gestation: Secondary | ICD-10-CM | POA: Diagnosis present

## 2014-08-19 DIAGNOSIS — IMO0001 Reserved for inherently not codable concepts without codable children: Secondary | ICD-10-CM

## 2014-08-19 HISTORY — DX: Anemia, unspecified: D64.9

## 2014-08-19 LAB — CBC
HCT: 31.5 % — ABNORMAL LOW (ref 36.0–46.0)
HEMOGLOBIN: 10.7 g/dL — AB (ref 12.0–15.0)
MCH: 30.4 pg (ref 26.0–34.0)
MCHC: 34 g/dL (ref 30.0–36.0)
MCV: 89.5 fL (ref 78.0–100.0)
Platelets: 281 10*3/uL (ref 150–400)
RBC: 3.52 MIL/uL — ABNORMAL LOW (ref 3.87–5.11)
RDW: 12.4 % (ref 11.5–15.5)
WBC: 10.5 10*3/uL (ref 4.0–10.5)

## 2014-08-19 LAB — TYPE AND SCREEN
ABO/RH(D): O POS
Antibody Screen: NEGATIVE

## 2014-08-19 LAB — RPR

## 2014-08-19 MED ORDER — INFLUENZA VAC SPLIT QUAD 0.5 ML IM SUSY
0.5000 mL | PREFILLED_SYRINGE | INTRAMUSCULAR | Status: AC
  Administered 2014-08-20: 0.5 mL via INTRAMUSCULAR
  Filled 2014-08-19: qty 0.5

## 2014-08-19 MED ORDER — LANOLIN HYDROUS EX OINT: TOPICAL_OINTMENT | CUTANEOUS | Status: DC | PRN

## 2014-08-19 MED ORDER — LIDOCAINE HCL (PF) 1 % IJ SOLN
30.0000 mL | INTRAMUSCULAR | Status: DC | PRN
  Filled 2014-08-19: qty 30

## 2014-08-19 MED ORDER — OXYCODONE-ACETAMINOPHEN 5-325 MG PO TABS: 1.0000 | ORAL_TABLET | ORAL | Status: DC | PRN

## 2014-08-19 MED ORDER — EPHEDRINE 5 MG/ML INJ
10.0000 mg | INTRAVENOUS | Status: DC | PRN
  Filled 2014-08-19: qty 2

## 2014-08-19 MED ORDER — ONDANSETRON HCL 4 MG PO TABS: 4.0000 mg | ORAL_TABLET | ORAL | Status: DC | PRN

## 2014-08-19 MED ORDER — FENTANYL 2.5 MCG/ML BUPIVACAINE 1/10 % EPIDURAL INFUSION (WH - ANES)
14.0000 mL/h | INTRAMUSCULAR | Status: DC | PRN
  Administered 2014-08-19: 14 mL/h via EPIDURAL
  Filled 2014-08-19: qty 125

## 2014-08-19 MED ORDER — FENTANYL 2.5 MCG/ML BUPIVACAINE 1/10 % EPIDURAL INFUSION (WH - ANES): 14.0000 mL/h | INTRAMUSCULAR | Status: DC | PRN

## 2014-08-19 MED ORDER — FLEET ENEMA 7-19 GM/118ML RE ENEM: 1.0000 | ENEMA | Freq: Every day | RECTAL | Status: DC | PRN

## 2014-08-19 MED ORDER — DIPHENHYDRAMINE HCL 25 MG PO CAPS: 25.0000 mg | ORAL_CAPSULE | Freq: Four times a day (QID) | ORAL | Status: DC | PRN

## 2014-08-19 MED ORDER — ONDANSETRON HCL 4 MG/2ML IJ SOLN: 4.0000 mg | Freq: Four times a day (QID) | INTRAMUSCULAR | Status: DC | PRN

## 2014-08-19 MED ORDER — ACETAMINOPHEN 325 MG PO TABS: 650.0000 mg | ORAL_TABLET | ORAL | Status: DC | PRN

## 2014-08-19 MED ORDER — LACTATED RINGERS IV SOLN
500.0000 mL | Freq: Once | INTRAVENOUS | Status: AC
  Administered 2014-08-19: 500 mL via INTRAVENOUS

## 2014-08-19 MED ORDER — CITRIC ACID-SODIUM CITRATE 334-500 MG/5ML PO SOLN: 30.0000 mL | ORAL | Status: DC | PRN

## 2014-08-19 MED ORDER — ZOLPIDEM TARTRATE 5 MG PO TABS
5.0000 mg | ORAL_TABLET | Freq: Every evening | ORAL | Status: DC | PRN
Start: 2014-08-19 — End: 2014-08-21

## 2014-08-19 MED ORDER — TETANUS-DIPHTH-ACELL PERTUSSIS 5-2.5-18.5 LF-MCG/0.5 IM SUSP
0.5000 mL | Freq: Once | INTRAMUSCULAR | Status: AC
  Administered 2014-08-20: 0.5 mL via INTRAMUSCULAR
  Filled 2014-08-19: qty 0.5

## 2014-08-19 MED ORDER — WITCH HAZEL-GLYCERIN EX PADS: 1.0000 "application " | MEDICATED_PAD | CUTANEOUS | Status: DC | PRN

## 2014-08-19 MED ORDER — BENZOCAINE-MENTHOL 20-0.5 % EX AERO: 1.0000 "application " | INHALATION_SPRAY | CUTANEOUS | Status: DC | PRN

## 2014-08-19 MED ORDER — LACTATED RINGERS IV SOLN: 500.0000 mL | INTRAVENOUS | Status: DC | PRN

## 2014-08-19 MED ORDER — PRENATAL MULTIVITAMIN CH
1.0000 | ORAL_TABLET | Freq: Every day | ORAL | Status: DC
Start: 2014-08-20 — End: 2014-08-21
  Administered 2014-08-20: 1 via ORAL
  Filled 2014-08-19: qty 1

## 2014-08-19 MED ORDER — PHENYLEPHRINE 40 MCG/ML (10ML) SYRINGE FOR IV PUSH (FOR BLOOD PRESSURE SUPPORT)
80.0000 ug | PREFILLED_SYRINGE | INTRAVENOUS | Status: DC | PRN
  Filled 2014-08-19: qty 10
  Filled 2014-08-19: qty 2

## 2014-08-19 MED ORDER — OXYTOCIN 40 UNITS IN LACTATED RINGERS INFUSION - SIMPLE MED
62.5000 mL/h | INTRAVENOUS | Status: DC
  Filled 2014-08-19: qty 1000

## 2014-08-19 MED ORDER — OXYTOCIN BOLUS FROM INFUSION
500.0000 mL | INTRAVENOUS | Status: DC
  Administered 2014-08-19: 500 mL via INTRAVENOUS

## 2014-08-19 MED ORDER — SENNOSIDES-DOCUSATE SODIUM 8.6-50 MG PO TABS
2.0000 | ORAL_TABLET | ORAL | Status: DC
  Administered 2014-08-19 – 2014-08-20 (×2): 2 via ORAL
  Filled 2014-08-19 (×2): qty 2

## 2014-08-19 MED ORDER — DIBUCAINE 1 % RE OINT: 1.0000 "application " | TOPICAL_OINTMENT | RECTAL | Status: DC | PRN

## 2014-08-19 MED ORDER — SIMETHICONE 80 MG PO CHEW: 80.0000 mg | CHEWABLE_TABLET | ORAL | Status: DC | PRN

## 2014-08-19 MED ORDER — ONDANSETRON HCL 4 MG/2ML IJ SOLN: 4.0000 mg | INTRAMUSCULAR | Status: DC | PRN

## 2014-08-19 MED ORDER — FERROUS SULFATE 325 (65 FE) MG PO TABS
325.0000 mg | ORAL_TABLET | Freq: Two times a day (BID) | ORAL | Status: DC
  Administered 2014-08-20 (×2): 325 mg via ORAL
  Filled 2014-08-19 (×2): qty 1

## 2014-08-19 MED ORDER — OXYCODONE-ACETAMINOPHEN 5-325 MG PO TABS: 2.0000 | ORAL_TABLET | ORAL | Status: DC | PRN

## 2014-08-19 MED ORDER — LACTATED RINGERS IV SOLN
INTRAVENOUS | Status: DC
  Administered 2014-08-19: 12:00:00 via INTRAVENOUS

## 2014-08-19 MED ORDER — PHENYLEPHRINE 40 MCG/ML (10ML) SYRINGE FOR IV PUSH (FOR BLOOD PRESSURE SUPPORT)
80.0000 ug | PREFILLED_SYRINGE | INTRAVENOUS | Status: DC | PRN
  Filled 2014-08-19: qty 2

## 2014-08-19 MED ORDER — BUTORPHANOL TARTRATE 1 MG/ML IJ SOLN: 1.0000 mg | INTRAMUSCULAR | Status: DC | PRN

## 2014-08-19 MED ORDER — LIDOCAINE HCL (PF) 1 % IJ SOLN
INTRAMUSCULAR | Status: DC | PRN
  Administered 2014-08-19: 4 mL
  Administered 2014-08-19: 6 mL

## 2014-08-19 MED ORDER — DIPHENHYDRAMINE HCL 50 MG/ML IJ SOLN: 12.5000 mg | INTRAMUSCULAR | Status: DC | PRN

## 2014-08-19 MED ORDER — IBUPROFEN 600 MG PO TABS
600.0000 mg | ORAL_TABLET | Freq: Four times a day (QID) | ORAL | Status: DC
  Administered 2014-08-19 – 2014-08-21 (×7): 600 mg via ORAL
  Filled 2014-08-19 (×7): qty 1

## 2014-08-19 NOTE — Anesthesia Preprocedure Evaluation (Signed)

## 2014-08-19 NOTE — MAU Note (Signed)
Patient state she has been having contractions every 7 minutes or so. No bleedin gor leaking and reports good fetal movement.

## 2014-08-19 NOTE — Anesthesia Procedure Notes (Signed)

## 2014-08-19 NOTE — Plan of Care (Signed)
Problem: Consults Goal: Postpartum Patient Education (See Patient Education module for education specifics.)  Outcome: Progressing  Problem: Phase I Progression Outcomes Goal: Pain controlled with appropriate interventions Outcome: Progressing Goal: Initial discharge plan identified Outcome: Completed/Met Date Met:  08/19/14

## 2014-08-19 NOTE — H&P (Signed)
NAMMahlon Gammon:  Allison Long, Allison Long               ACCOUNT NO.:  [id number]  MEDICAL RECORD NO.:  [id number]  LOCATION:  9106                          FACILITY:  [facility]  PHYSICIAN:  Kathreen CosierBernard A. Marshall, M.D.DATE OF BIRTH:  Jan 07, 1991  DATE OF ADMISSION:  08/19/2014 DATE OF DISCHARGE:                             HISTORY & PHYSICAL   HISTORY OF PRESENT ILLNESS:  The patient is a 23 year old, gravida 2, para 1-0-0-1 at 39 weeks and 1 day.  [facility].  Membranes ruptured spontaneously and she had a normal vaginal delivery of a female, Apgars 8 and 9.  Placenta spontaneous and intact.  No episiotomy or laceration.  PAST MEDICAL HISTORY:  Negative.  PAST SURGICAL HISTORY:  Negative.  SOCIAL HISTORY:  Negative.  REVIEW OF SYSTEMS:  Negative.  PHYSICAL EXAMINATION:  GENERAL:  Well-developed female, post delivery. HEENT:  Negative. LUNGS:  Clear to P and A. HEART:  Regular rhythm.  No murmurs, no gallops. LUNGS:  Clear to P and A. ABDOMEN:  Uterus 20-week postpartum size. PELVIC:  As described above. EXTREMITIES:  Negative.          ______________________________ Kathreen CosierBernard A. Marshall, M.D.     BAM/MEDQ  D:  08/19/2014  T:  08/19/2014  Job:  [id number]

## 2014-08-20 LAB — CBC
HCT: 29.9 % — ABNORMAL LOW (ref 36.0–46.0)
Hemoglobin: 10.3 g/dL — ABNORMAL LOW (ref 12.0–15.0)
MCH: 30.4 pg (ref 26.0–34.0)
MCHC: 34.4 g/dL (ref 30.0–36.0)
MCV: 88.2 fL (ref 78.0–100.0)
Platelets: 262 10*3/uL (ref 150–400)
RBC: 3.39 MIL/uL — AB (ref 3.87–5.11)
RDW: 12.6 % (ref 11.5–15.5)
WBC: 11.4 10*3/uL — AB (ref 4.0–10.5)

## 2014-08-20 LAB — ABO/RH: ABO/RH(D): O POS

## 2014-08-20 NOTE — Progress Notes (Signed)
Patient ID: Allison BeetsJasmine L Long, female   DOB: 11/10/1990, 23 y.o.   MRN: [id number] Postpartum day one Vital signs normal Fundus firm Doing well

## 2014-08-20 NOTE — Plan of Care (Signed)
Problem: Phase I Progression Outcomes Goal: Pain controlled with appropriate interventions Outcome: Completed/Met Date Met:  08/20/14 Goal: Voiding adequately Outcome: Completed/Met Date Met:  08/20/14 Goal: OOB as tolerated unless otherwise ordered Outcome: Completed/Met Date Met:  08/20/14 Goal: VS, stable, temp < 100.4 degrees F Outcome: Completed/Met Date Met:  08/20/14 Goal: Other Phase I Outcomes/Goals Outcome: Completed/Met Date Met:  08/20/14  Problem: Phase II Progression Outcomes Goal: Pain controlled on oral analgesia Outcome: Completed/Met Date Met:  08/20/14 Goal: Progress activity as tolerated unless otherwise ordered Outcome: Completed/Met Date Met:  08/20/14 Goal: Afebrile, VS remain stable Outcome: Completed/Met Date Met:  08/20/14 Goal: Tolerating diet Outcome: Completed/Met Date Met:  08/20/14 Goal: Other Phase II Outcomes/Goals Outcome: Completed/Met Date Met:  08/20/14

## 2014-08-20 NOTE — Plan of Care (Signed)
Problem: Discharge Progression Outcomes Goal: Tolerating diet Outcome: Completed/Met Date Met:  08/20/14

## 2014-08-20 NOTE — Progress Notes (Signed)
UR chart review completed.  

## 2014-08-20 NOTE — Progress Notes (Signed)
Clinical Social Work Department PSYCHOSOCIAL ASSESSMENT - MATERNAL/CHILD 08/20/2014  Patient:  Allison, Long  Account Number:  [id number]  [place] Date:  08/19/2014  Ardine Eng Name:   Allison Long    Clinical Social Worker:  Bryn Gulling, CLINICAL SOCIAL WORKER   Date/Time:  08/20/2014 03:09 PM  Date Referred:  08/20/2014   Referral source  Central Nursery  Physician     Referred reason  Substance Abuse   Other referral source:    I:  FAMILY / [place] legal guardian:  PARENT  Guardian - Name Guardian - Age Guardian - Address  Allison Long [place] [address] [employer]   Other household support members/support persons Name Relationship DOB  FOB FATHER   PGM GRAND MOTHER   AJ SON 22 years old   Other support:    II  PSYCHOSOCIAL DATA Information Source:  Patient Interview  Occupational hygienist Employment:   FOB works at [employer]. Allison Long will be returning to work at [employer] when she returns home.   Financial resources:  Medicaid If Medicaid - County:  [employer] / Grade:   Maternity Care Coordinator / Child Services Coordination / Early Interventions:  Cultural issues impacting care:   None stated.    III  STRENGTHS Strengths  Adequate Resources  Home prepared for Child (including basic supplies)  Supportive family/friends  Other - See comment   Strength comment:  Strong bond between Allison Long and Allison Long, Allison Long informed MSW intern that "she was on top of her daughter to ensure her pregnancy was perfect."   IV  RISK FACTORS AND CURRENT PROBLEMS Current Problem:  None   Risk Factor & Current Problem Patient Issue Family Issue Risk Factor / Current Problem Comment   N N     V  SOCIAL WORK ASSESSMENT MSW intern met with Allison Long room 106 due to history of marijuana use. MSW intern entered room to find Allison Long in bed with baby and her 22 year old son. Allison Long and Allison Long's little sister were also in the room. Allison Long's affect was alert, and  responsive. Allison Long's mood was upbeat and welcoming. MSW intern asked Allison Long how she was feeling now that she has two babies under three years old. Allison Long replied, "I'm ready but I'm not ready you know?" MSW intern validated Allison Long's feelings. MSW intern asked Allison Long if she was planning to work, Allison Long replied yes and that her job at [employer] was waiting for her when she was ready to return. Allison Long stated that FOB also works at [employer] and will be working while she is recovering at home. MSW intern asked if Allison Long had everything at home she needed for baby, Allison Long stated that they have all the basics. MSW intern inquired about a safe sleeping space for baby and went over the risk factors for SIDS. MSW intern inquired about Allison Long's emotions throughout her pregnancy, she stated that she was more tearful than usual but otherwise she was "even keel." MSW intern asked if knew of PPD, Allison Long replied, yes. MSW intern asked if Allison Long experienced PPD after the birth of her first baby, she stated no. MSW intern offered PPD education and asked if Allison Long felt comfortable calling her doctor if she noticed any symptoms, she stated yes. MSW intern informed Allison Long of hospital drug screen policy and explained MSW intern was there to speak with her about it. Allison Long reported she knew that this was coming, and has no concerns about this baby because she did  not smoke during this pregnancy. Allison Long interjected and informed MSW intern that she was "on top on her during this pregnancy to ensure it was perfect." Allison Long smiled, nodded in agreement with Allison Long. MSW intern asked Allison Long when was the last time that she smoked, Allison Long reported it was after the birth of her first baby. Allison Long disclosed that she did smoke about four months into her first pregnancy, but had "learned from that experience" and that she has had no problem abstaining from marijuana during this pregnancy and does not currently smoke. MSW intern asked if Allison Long or Allison Long had any other questions or concerns. Allison Long asked about how to  get additional resources for baby, MSW intern offered making an appointment with [facility], and to also explore [facility], and [facility]. MSW intern answered questions to Allison Long's and Allison Long's satisfaction. MSW intern sees no barriers to discharge at this time.      [place] MSW intern sees no barriers to discharge.    Type of pt/family education:   MSW intern provided SIDS and PPD education.   If child protective services report - county:   If child protective services report - date:   Information/referral to community resources comment:   Other social work plan:

## 2014-08-20 NOTE — Anesthesia Postprocedure Evaluation (Signed)
  Anesthesia Post-op Note  Patient: Allison Long  Procedure(s) Performed: * No procedures listed *  Patient Location: PACU and Mother/Baby  Anesthesia Type:Epidural  Level of Consciousness: awake, alert  and oriented  Airway and Oxygen Therapy: Patient Spontanous Breathing  Post-op Pain: mild  Post-op Assessment: Patient's Cardiovascular Status Stable, Respiratory Function Stable, No signs of Nausea or vomiting, Adequate PO intake, Pain level controlled, No headache, No backache, No residual numbness and No residual motor weakness  Post-op Vital Signs: Reviewed and stable  Last Vitals:  Filed Vitals:   08/20/14 0548  BP: 97/59  Pulse: 63  Temp: 36.7 C  Resp: 18    Complications: No apparent anesthesia complications

## 2014-08-20 NOTE — Lactation Note (Signed)
This note was copied from the chart of Allison Mahlon GammonJasmine Wade. Lactation Consultation Note Mom BF her 1 stchild for 2 months. stopped d/t she thought the baby was hungry because he wanted to eat all the time. After she stopped BF she had all this milk but she didn't want to BF anymore. States this baby is BF well but she's not sure how long she will BF d/t its not really for her but she is going to try it. Hand expression demonstrated to show colostrum. Has good everted nipples. Mom encouraged to feed baby 8-12 times/24 hours and with feeding cues. Referred to Baby and Me Book in Breastfeeding section Pg. 22-23 for position options and Proper latch demonstration.[facility]/LC brochure given w/resources, support groups and LC services. Educated about newborn behavior. Mom encouraged to do skin-to-skin.Mom encouraged to waken baby for feeds. Encouraged to call for assistance if needed and to verify proper latch. Patient Name: Allison Long Today's Date: 08/20/2014 Reason for consult: Initial assessment   Maternal Data Has patient been taught Hand Expression?: Yes Does the patient have breastfeeding experience prior to this delivery?: Yes  Feeding Feeding Type: Breast Fed Length of feed: 35 min  LATCH Score/Interventions                      Lactation Tools Discussed/Used     Consult Status Consult Status: Follow-up Date: 08/20/14 Follow-up type: In-patient    Charyl DancerCARVER, Allison Long 08/20/2014, 5:40 AM

## 2014-08-20 NOTE — Plan of Care (Signed)
Problem: Discharge Progression Outcomes Goal: Activity appropriate for discharge plan Outcome: Completed/Met Date Met:  08/20/14 Goal: Pain controlled with appropriate interventions Outcome: Completed/Met Date Met:  08/20/14 Goal: Discharge plan in place and appropriate Outcome: Completed/Met Date Met:  08/20/14

## 2014-08-21 NOTE — Discharge Summary (Signed)
  Obstetric Discharge Summary Reason for Admission: onset of labor Prenatal Procedures: none Intrapartum Procedures: spontaneous vaginal delivery Postpartum Procedures: none Complications-Operative and Postpartum: none  HEMOGLOBIN  Date Value Ref Range Status  08/20/2014 10.3* 12.0 - 15.0 g/dL Final   HCT  Date Value Ref Range Status  08/20/2014 29.9* 36.0 - 46.0 % Final    Physical Exam:  General: alert Lochia: appropriate Uterine: firm Incision: n/a DVT Evaluation: No evidence of DVT seen on physical exam.  Discharge Diagnoses: Active Problems:   Active labor at term   NVD (normal vaginal delivery)   Discharge Information: Date: 08/21/2014 Activity: pelvic rest Diet: routine Medications:  Prior to Admission medications   Not on File    Condition: stable Instructions: refer to routine discharge instructions Discharge to: home Follow-up Information    Follow up with MARSHALL,BERNARD A, MD. Schedule an appointment as soon as possible for a visit in 2 weeks.   Specialty:  Obstetrics and Gynecology   Contact information:   [address] [place] [address] [phone]       Newborn Data:  Live born female  Birth Weight: 6 lb 4.7 oz (2855 g) APGAR: 9, 9   Home with mother.  JACKSON-MOORE,Graysin Luczynski A 08/21/2014, 9:50 AM

## 2014-08-21 NOTE — Plan of Care (Signed)
Problem: Discharge Progression Outcomes Goal: Complications resolved/controlled Outcome: Completed/Met Date Met:  08/21/14

## 2014-08-21 NOTE — Plan of Care (Signed)
Problem: Consults Goal: Postpartum Patient Education (See Patient Education module for education specifics.)  Outcome: Completed/Met Date Met:  08/21/14

## 2014-08-21 NOTE — Plan of Care (Signed)
Problem: Discharge Progression Outcomes Goal: Barriers To Progression Addressed/Resolved Outcome: Not Applicable Date Met:  10/93/23

## 2014-08-21 NOTE — Plan of Care (Signed)
Problem: Discharge Progression Outcomes Goal: Afebrile, VS remain stable at discharge Outcome: Completed/Met Date Met:  08/21/14     

## 2014-08-21 NOTE — Plan of Care (Signed)
Problem: Discharge Progression Outcomes Goal: Other Discharge Outcomes/Goals Outcome: Completed/Met Date Met:  08/21/14     

## 2014-08-21 NOTE — Discharge Instructions (Signed)

## 2015-10-16 NOTE — L&D Delivery Note (Signed)
  Delivery Note The baby ws having repetitive deep and prolonged varibale decels.  THe anterior lip of the cervix was reduced and the pt pushed about 10 minutes.   At 12:13 PM a viable female was delivered via Vaginal, Spontaneous Delivery (Presentation:LOA ;  ).  APGAR: 8, 9; weight  .  Pending. .After 2 minutes, the cord was clamped and cut. 40 units of pitocin diluted in 1000cc LR was infused rapidly IV.    I was called to another deliver, and Dr. Emelda FearFerguson came in for third stage menagement. The placenta separated spontaneously and delivered via CCT and maternal pushing effort.  It was inspected and appears to be intact with a 3 VC.   Anesthesia:  epidural Episiotomy: None Lacerations: None Suture Repair:  Est. Blood Loss (mL): 400  Mom to postpartum.  Baby to Couplet care / Skin to Skin.  CRESENZO-DISHMAN,Armya Westerhoff 09/06/2016, 1:01 PM

## 2016-01-10 ENCOUNTER — Encounter (HOSPITAL_COMMUNITY): Payer: Self-pay | Admitting: Emergency Medicine

## 2016-01-10 ENCOUNTER — Emergency Department (INDEPENDENT_AMBULATORY_CARE_PROVIDER_SITE_OTHER)
Admission: EM | Admit: 2016-01-10 | Discharge: 2016-01-10 | Disposition: A | Discharge status: Home / Self Care | Payer: Self-pay | Source: Home / Self Care | Attending: Family Medicine | Admitting: Family Medicine

## 2016-01-10 DIAGNOSIS — O26891 Other specified pregnancy related conditions, first trimester: Secondary | ICD-10-CM

## 2016-01-10 DIAGNOSIS — R51 Headache: Secondary | ICD-10-CM

## 2016-01-10 DIAGNOSIS — R519 Headache, unspecified: Secondary | ICD-10-CM

## 2016-01-10 LAB — POCT PREGNANCY, URINE: Preg Test, Ur: POSITIVE — AB

## 2016-01-10 MED ORDER — PRENATAL VITAMIN 27-0.8 MG PO TABS: 1.0000 | ORAL_TABLET | Freq: Every day | ORAL | Status: DC

## 2016-01-10 MED ORDER — ONDANSETRON HCL 4 MG PO TABS: 4.0000 mg | ORAL_TABLET | Freq: Two times a day (BID) | ORAL | Status: DC | PRN

## 2016-01-10 NOTE — Discharge Instructions (Signed)
It is a pleasure to see you today.  As we discussed, the frequent use of over-the-counter headache/pain medicine can actually contribute to the daily headaches.   I ask that you not take the Tylenol or the [employer] medicine any longer.    I am giving you a prescription for an anti-nausea medicine, ONDANSETRON 4mg  tablets, take 1 tablet by mouth when you have severe nausea, up to every 12 hours.   Prenatal vitamins one daily.   Make appointment with Dr Gaynell FaceMarshall as soon as you are able.

## 2016-01-10 NOTE — ED Provider Notes (Addendum)
CSN: [id number]     Arrival date & time 01/10/16  1811 History   First MD Initiated Contact with Patient 01/10/16 2006     Chief Complaint  Patient presents with  . Headache   (Consider location/radiation/quality/duration/timing/severity/associated sxs/prior Treatment) Patient is a 25 y.o. female presenting with headaches. The history is provided by the patient. No language interpreter was used.  Headache Associated symptoms: nausea   Associated symptoms: no abdominal pain, no congestion, no cough, no dizziness, no ear pain, no fatigue, no fever, no seizures, no sinus pressure, no vomiting and no weakness   Patient with complaint of central frontal headache daily over the past several (estimated 6) months. Notices headache in the morning when she wakes up, gets a little better when she takes Tylenol and a headache medicine from the [employer], then gets worse. Associated with photophobia and some nausea, no vomiting.  No head trauma, no vision changes. Uses prescription eyeglasses but often does not wear them.  No history of migraines.  Headache is a 3/10 now; was a 6/10 on pain scale earlier today.   LMP Nov 29, 2015.  Took home pregnancy test last week, was positive. Is a G3P2, has a son and daughter born after uncomplicated pregnancies.   NKDA.   Past Medical History  Diagnosis Date  . Chlamydia     x 2  . Anemia    Past Surgical History  Procedure Laterality Date  . No past surgeries     Family History  Problem Relation Age of Onset  . Thyroid disease Mother   . Cancer Father     colon   Social History  Substance Use Topics  . Smoking status: Current Every Day Smoker -- 0.50 packs/day    Types: Cigarettes  . Smokeless tobacco: None  . Alcohol Use: Yes     Comment: occasional   OB History    Gravida Para Term Preterm AB TAB SAB Ectopic Multiple Living   3 2 2  0 0 0 0 0 0 2     Review of Systems  Constitutional: Negative for fever and fatigue.  HENT: Negative for  congestion, dental problem, ear pain, facial swelling, sinus pressure and sneezing.   Respiratory: Negative for cough and shortness of breath.   Gastrointestinal: Positive for nausea. Negative for vomiting and abdominal pain.  Neurological: Positive for headaches. Negative for dizziness, tremors, seizures, syncope and weakness.    Allergies  Review of patient's allergies indicates no known allergies.  Home Medications   Prior to Admission medications   Medication Sig Start Date End Date Taking? Authorizing Provider  OVER THE COUNTER MEDICATION Over the counter medicines: tylenol, dollar store headache pills   Yes Historical Provider, MD   Meds Ordered and Administered this Visit  Medications - No data to display  BP 104/59 mmHg  Pulse 82  Temp(Src) 99.4 F (37.4 C) (Oral)  Resp 12  SpO2 100%  LMP 11/29/2015 No data found.   Physical Exam  Constitutional: She appears well-developed and well-nourished. No distress.  HENT:  Head: Normocephalic and atraumatic.  Right Ear: External ear normal.  Left Ear: External ear normal.  Nose: Nose normal.  Mouth/Throat: Oropharynx is clear and moist. No oropharyngeal exudate.  No frontal or maxillary sinus tenderness.   Nasal mucosa intact without lesions.   Eyes: Conjunctivae and EOM are normal. Pupils are equal, round, and reactive to light. Right eye exhibits no discharge. Left eye exhibits no discharge. No scleral icterus.  Neck: Normal range  of motion. Neck supple. No tracheal deviation present.  Pulmonary/Chest: No stridor.  Lymphadenopathy:    She has no cervical adenopathy.  Neurological:  Sensation in hands intact. No focal weakness in UEs or LEs. Gait unremarkable.   Skin: She is not diaphoretic.    ED Course  Procedures (including critical care time)  Labs Review Labs Reviewed - No data to display  Imaging Review No results found.   Visual Acuity Review  Right Eye Distance:   Left Eye Distance:   Bilateral  Distance:    Right Eye Near:   Left Eye Near:    Bilateral Near:         MDM   1. Headache in pregnancy, antepartum, first trimester    Patient with recurrent (daily) headaches, no focal neurologic symptoms or findings on exam.  Consider tension headache vs analgesic-induced headache.  Discussed plan to abstain from all otc analgesics; may use anti-nausea medicine if nausea is severe (ondansetron).  Important to schedule appt with her OB/GYN; to start PNV.   Discussed with her, she is in agreement.   Paula Compton, MD    Barbaraann Barthel, MD 01/10/16 1610  Barbaraann Barthel, MD 01/10/16 [phone]

## 2016-01-10 NOTE — ED Notes (Signed)
Patient complains of headaches for 6 months.  Patient reports having headache everyday.  Head pain is usually forehead, temporal area or center of head.  Patient reports she is pregnant.  No changes in vision.  Patient reports she sometimes gets lightheaded and has light sensitivity

## 2016-04-03 ENCOUNTER — Ambulatory Visit (INDEPENDENT_AMBULATORY_CARE_PROVIDER_SITE_OTHER): Payer: Medicaid Other | Admitting: Certified Nurse Midwife

## 2016-04-03 ENCOUNTER — Encounter: Payer: Self-pay | Admitting: Certified Nurse Midwife

## 2016-04-03 DIAGNOSIS — O0932 Supervision of pregnancy with insufficient antenatal care, second trimester: Secondary | ICD-10-CM | POA: Diagnosis not present

## 2016-04-03 DIAGNOSIS — Z3492 Encounter for supervision of normal pregnancy, unspecified, second trimester: Secondary | ICD-10-CM

## 2016-04-03 DIAGNOSIS — Z331 Pregnant state, incidental: Secondary | ICD-10-CM | POA: Diagnosis not present

## 2016-04-03 DIAGNOSIS — Z1389 Encounter for screening for other disorder: Secondary | ICD-10-CM | POA: Diagnosis not present

## 2016-04-03 DIAGNOSIS — Z3482 Encounter for supervision of other normal pregnancy, second trimester: Secondary | ICD-10-CM

## 2016-04-03 LAB — POCT URINALYSIS DIPSTICK
BILIRUBIN UA: NEGATIVE
Blood, UA: NEGATIVE
Glucose, UA: NEGATIVE
KETONES UA: NEGATIVE
LEUKOCYTES UA: NEGATIVE
Nitrite, UA: NEGATIVE
Protein, UA: NEGATIVE
Spec Grav, UA: 1.01
Urobilinogen, UA: NEGATIVE
pH, UA: 7.5

## 2016-04-03 MED ORDER — VITAFOL GUMMIES 3.33-0.333-34.8 MG PO CHEW: 3.0000 | CHEWABLE_TABLET | Freq: Every day | ORAL | Status: DC

## 2016-04-03 NOTE — Progress Notes (Signed)
Subjective:    Allison Long is being seen today for her first obstetrical visit.  This is not a planned pregnancy. She is at Unknown gestation. Her obstetrical history is significant for past smoker. Relationship with FOB: significant other, living together. FOB = [name].Patient does intend to breast feed. Pregnancy history fully reviewed.  Pregnancy verified with health department.  Denies current nausea, vomiting, cramping, back pain, contractions.  Reports vaginal discharge, white-yellowish, getting on underwear (concerned d/t HX of chlamydia).  Reports having an "incident" with another partner 4-5 months ago.  Had candida during last pregnancy.  Had BV at 25 years old.  Desires STD screening today.  Taking OTC prenatal vitamins, would like RX for gummies.  Last PAP: 3 years, unsure if normal or abnormal.  Interested in IUD after delivery.  The information documented in the HPI was reviewed and verified.  Menstrual History: OB History    Gravida Para Term Preterm AB TAB SAB Ectopic Multiple Living   3 2 2  0 0 0 0 0 0 2      Menarche age: [age]  Patient's last menstrual period was 11/29/2015 (lmp unknown).    Past Medical History  Diagnosis Date  . Chlamydia     x 2  . Anemia     Past Surgical History  Procedure Laterality Date  . No past surgeries       (Not in a hospital admission) No Known Allergies  Social History  Substance Use Topics  . Smoking status: Current Every Day Smoker -- 0.50 packs/day    Types: Cigarettes  . Smokeless tobacco: Not on file  . Alcohol Use: Yes     Comment: occasional    Family History  Problem Relation Age of Onset  . Thyroid disease Mother   . Cancer Father     colon     Review of Systems Constitutional: negative for weight loss Gastrointestinal: negative for vomiting Genitourinary:negative for genital lesions and dysuria.  Positive for vaginal discharge. Musculoskeletal:negative for back pain Behavioral/Psych: negative for  abusive relationship, depression, illegal drug usage and tobacco use    Objective:    LMP 11/29/2015 (LMP Unknown) General Appearance:    Alert, cooperative, no distress, appears stated age  Head:    Normocephalic, without obvious abnormality, atraumatic  Eyes:    PERRL, conjunctiva/corneas clear, EOM's intact, fundi    benign, both eyes  Ears:    Normal TM's and external ear canals, both ears  Nose:   Nares normal, septum midline, mucosa normal, no drainage    or sinus tenderness  Throat:   Lips, mucosa, and tongue normal; teeth and gums normal  Neck:   Supple, symmetrical, trachea midline, no adenopathy;    thyroid:  no enlargement/tenderness/nodules; no carotid   bruit or JVD  Back:     Symmetric, no curvature, ROM normal, no CVA tenderness  Lungs:     Clear to auscultation bilaterally, respirations unlabored  Chest Wall:    No tenderness or deformity   Heart:    Regular rate and rhythm, S1 and S2 normal, no murmur, rub   or gallop  Breast Exam:    No tenderness, masses, or nipple abnormality  Abdomen:     Soft, non-tender, bowel sounds active all four quadrants,    no masses, no organomegaly  Genitalia:    Normal female without lesion, discharge or tenderness  Extremities:   Extremities normal, atraumatic, no cyanosis or edema  Pulses:   2+ and symmetric all extremities  Skin:  Skin color, texture, turgor normal, no rashes or lesions  Lymph nodes:   Cervical, supraclavicular, and axillary nodes normal  Neurologic:   CNII-XII intact, normal strength, sensation and reflexes    throughout      Lab Review Urine pregnancy test Labs reviewed yes Radiologic studies reviewed no Assessment:    Pregnancy at Unknown weeks   PAP and STD screening performing. Fundal height at umbilicus. Cervix digitally shortened by exam.  Outer os 0.5 cm dilated.   Plan:      Prenatal vitamins.  Counseling provided regarding continued use of seat belts, cessation of alcohol consumption,  smoking or use of illicit drugs; infection precautions i.e., influenza/TDAP immunizations, toxoplasmosis,CMV, parvovirus, listeria and varicella; workplace safety, exercise during pregnancy; routine dental care, safe medications, sexual activity, hot tubs, saunas, pools, travel, caffeine use, fish and methlymercury, potential toxins, hair treatments, varicose veins Weight gain recommendations per IOM guidelines reviewed: underweight/BMI< 18.5--> gain 28 - 40 lbs; normal weight/BMI 18.5 - 24.9--> gain 25 - 35 lbs; overweight/BMI 25 - 29.9--> gain 15 - 25 lbs; obese/BMI >30->gain  11 - 20 lbs Problem list reviewed and updated. FIRST/CF mutation testing/NIPT/QUAD SCREEN/fragile X/Ashkenazi Jewish population testing/Spinal muscular atrophy discussed: requested. Role of ultrasound in pregnancy discussed; fetal survey: requested.  U/S ordered at [facility].  Amniocentesis discussed: not indicated. VBAC calculator score: VBAC consent form provided Meds ordered this encounter  Medications  . Prenatal Vit-Fe Phos-FA-Omega (VITAFOL GUMMIES) 3.33-0.333-34.8 MG CHEW    Sig: Chew 3 tablets by mouth daily.    Dispense:  90 tablet    Refill:  12   Orders Placed This Encounter  Procedures  . Culture, OB Urine  . [place] MFM OB COMP + 14 WK    Standing Status: Future     Number of Occurrences:      Standing Expiration Date: 06/03/2017    Order Specific Question:  Reason for Exam (SYMPTOM  OR DIAGNOSIS REQUIRED)    Answer:  fetal anatomy scan, cervical length (short on digital exam)    Order Specific Question:  Preferred imaging location?    Answer:  [facility]-Ultrasound  . TSH  . HIV antibody  . Hemoglobinopathy evaluation  . Varicella zoster antibody, IgG  . Prenatal Profile I  . MaterniT21 PLUS Core+SCA    Order Specific Question:  Is the patient insulin dependent?    Answer:  No    Order Specific Question:  Please enter gestational age. This should be expressed as weeks AND days, i.e. 16w 6d. Enter weeks here.  Enter days in next question.    Answer:  98    Order Specific Question:  Please enter gestational age. This should be expressed as weeks AND days, i.e. 16w 6d. Enter days here. Enter weeks in previous question.    Answer:  0    Order Specific Question:  How was gestational age calculated?    Answer:  LMP    Order Specific Question:  Please give the date of LMP OR Ultrasound OR Estimated date of delivery.    Answer:  09/04/2016    Order Specific Question:  Number of Fetuses (Type of Pregnancy):    Answer:  1    Order Specific Question:  Indications for performing the test? (please choose all that apply):    Answer:  Routine screening    Order Specific Question:  Other Indications? (Y=Yes, N=No)    Answer:  N    Order Specific Question:  If this is a repeat specimen, please indicate the reason:  Answer:  Not indicated    Order Specific Question:  Please specify the patient's race: (C=White/Caucasion, B=Black, I=Native American, A=Asian, H=Hispanic, O=Other, U=Unknown)    Answer:  B    Order Specific Question:  Donor Egg - indicate if the egg was obtained from in vitro fertilization.    Answer:  N    Order Specific Question:  Age of Egg Donor.    Answer:  79    Order Specific Question:  Prior Down Syndrome/ONTD screening during current pregnancy.    Answer:  N    Order Specific Question:  Prior First Trimester Testing    Answer:  N    Order Specific Question:  Prior Second Trimester Testing    Answer:  N    Order Specific Question:  Family History of Neural Tube Defects    Answer:  N    Order Specific Question:  Prior Pregnancy with Down Syndrome    Answer:  N    Order Specific Question:  Please give the patient's weight (in pounds)    Answer:  160  . POCT urinalysis dipstick    Follow up in 4 weeks. 50% of 30 min visit spent on counseling and coordination of care.

## 2016-04-04 DIAGNOSIS — O0932 Supervision of pregnancy with insufficient antenatal care, second trimester: Secondary | ICD-10-CM | POA: Insufficient documentation

## 2016-04-04 DIAGNOSIS — Z3480 Encounter for supervision of other normal pregnancy, unspecified trimester: Secondary | ICD-10-CM | POA: Insufficient documentation

## 2016-04-04 NOTE — Progress Notes (Signed)
I agree with note by NP Student Andrew Brake.  Was present for exam.  R.Mearle Drew CNM 

## 2016-04-05 LAB — PAP IG W/ RFLX HPV ASCU: PAP Smear Comment: 0

## 2016-04-05 LAB — URINE CULTURE, OB REFLEX

## 2016-04-05 LAB — CULTURE, OB URINE

## 2016-04-06 LAB — NUSWAB VG+, CANDIDA 6SP
ATOPOBIUM VAGINAE: HIGH {score} — AB
BVAB 2: HIGH {score} — AB
CANDIDA GLABRATA, NAA: NEGATIVE
CANDIDA KRUSEI, NAA: NEGATIVE
CANDIDA LUSITANIAE, NAA: NEGATIVE
CANDIDA PARAPSILOSIS, NAA: NEGATIVE
CANDIDA TROPICALIS, NAA: NEGATIVE
Candida albicans, NAA: NEGATIVE
Chlamydia trachomatis, NAA: NEGATIVE
Megasphaera 1: HIGH Score — AB
Neisseria gonorrhoeae, NAA: NEGATIVE
TRICH VAG BY NAA: NEGATIVE

## 2016-04-09 ENCOUNTER — Other Ambulatory Visit: Payer: Self-pay | Admitting: Certified Nurse Midwife

## 2016-04-09 ENCOUNTER — Ambulatory Visit (HOSPITAL_COMMUNITY)
Admission: RE | Admit: 2016-04-09 | Discharge: 2016-04-09 | Disposition: A | Discharge status: Home / Self Care | Payer: Medicaid Other | Source: Ambulatory Visit | Attending: Certified Nurse Midwife | Admitting: Certified Nurse Midwife

## 2016-04-09 DIAGNOSIS — O0932 Supervision of pregnancy with insufficient antenatal care, second trimester: Secondary | ICD-10-CM

## 2016-04-09 DIAGNOSIS — Z36 Encounter for antenatal screening of mother: Secondary | ICD-10-CM | POA: Diagnosis present

## 2016-04-09 DIAGNOSIS — Z3482 Encounter for supervision of other normal pregnancy, second trimester: Secondary | ICD-10-CM

## 2016-04-09 DIAGNOSIS — Z3A18 18 weeks gestation of pregnancy: Secondary | ICD-10-CM | POA: Insufficient documentation

## 2016-04-09 DIAGNOSIS — Z1389 Encounter for screening for other disorder: Secondary | ICD-10-CM

## 2016-04-10 ENCOUNTER — Other Ambulatory Visit: Payer: Self-pay | Admitting: Certified Nurse Midwife

## 2016-04-10 DIAGNOSIS — N760 Acute vaginitis: Principal | ICD-10-CM

## 2016-04-10 DIAGNOSIS — B9689 Other specified bacterial agents as the cause of diseases classified elsewhere: Secondary | ICD-10-CM

## 2016-04-10 MED ORDER — METRONIDAZOLE 500 MG PO TABS: 500.0000 mg | ORAL_TABLET | Freq: Two times a day (BID) | ORAL | Status: DC

## 2016-04-11 ENCOUNTER — Telehealth (HOSPITAL_COMMUNITY): Payer: Self-pay | Admitting: *Deleted

## 2016-04-11 LAB — MATERNIT21 PLUS CORE+SCA
CHROMOSOME 21: NEGATIVE
Chromosome 13: NEGATIVE
Chromosome 18: NEGATIVE
PDF: 0
Y Chromosome: NOT DETECTED

## 2016-04-11 LAB — PRENATAL PROFILE I(LABCORP)
ANTIBODY SCREEN: NEGATIVE
BASOS: 0 %
Basophils Absolute: 0 10*3/uL (ref 0.0–0.2)
EOS (ABSOLUTE): 0.1 10*3/uL (ref 0.0–0.4)
EOS: 2 %
HEMATOCRIT: 33.5 % — AB (ref 34.0–46.6)
Hemoglobin: 11.6 g/dL (ref 11.1–15.9)
Hepatitis B Surface Ag: NEGATIVE
Immature Grans (Abs): 0 10*3/uL (ref 0.0–0.1)
Immature Granulocytes: 0 %
Lymphocytes Absolute: 1.6 10*3/uL (ref 0.7–3.1)
Lymphs: 25 %
MCH: 31.6 pg (ref 26.6–33.0)
MCHC: 34.6 g/dL (ref 31.5–35.7)
MCV: 91 fL (ref 79–97)
MONOCYTES: 8 %
Monocytes Absolute: 0.5 10*3/uL (ref 0.1–0.9)
NEUTROS ABS: 4.1 10*3/uL (ref 1.4–7.0)
Neutrophils: 65 %
Platelets: 300 10*3/uL (ref 150–379)
RBC: 3.67 x10E6/uL — ABNORMAL LOW (ref 3.77–5.28)
RDW: 12.1 % — ABNORMAL LOW (ref 12.3–15.4)
RH TYPE: POSITIVE
RPR: NONREACTIVE
RUBELLA: 16.4 {index} (ref >0.99)
WBC: 6.3 10*3/uL (ref 3.4–10.8)

## 2016-04-11 LAB — TSH: TSH: 1.16 u[IU]/mL (ref 0.450–4.500)

## 2016-04-11 LAB — HEMOGLOBINOPATHY EVALUATION
HEMOGLOBIN F QUANTITATION: 0 % (ref 0.0–2.0)
HGB A: 97.4 % (ref 94.0–98.0)
HGB C: 0 %
HGB S: 0 %
Hemoglobin A2 Quantitation: 2.6 % (ref 0.7–3.1)

## 2016-04-11 LAB — HIV ANTIBODY (ROUTINE TESTING W REFLEX): HIV SCREEN 4TH GENERATION: NONREACTIVE

## 2016-04-11 LAB — VARICELLA ZOSTER ANTIBODY, IGG: VARICELLA: 1907 {index} (ref >165)

## 2016-04-12 ENCOUNTER — Other Ambulatory Visit: Payer: Self-pay | Admitting: Certified Nurse Midwife

## 2016-05-01 ENCOUNTER — Encounter: Payer: Medicaid Other | Admitting: Certified Nurse Midwife

## 2016-05-02 NOTE — Telephone Encounter (Signed)
See phone note for this encounter. 

## 2016-05-08 ENCOUNTER — Ambulatory Visit (INDEPENDENT_AMBULATORY_CARE_PROVIDER_SITE_OTHER): Payer: Medicaid Other | Admitting: Certified Nurse Midwife

## 2016-05-08 VITALS — BP 101/58 | HR 98 | Temp 98.0°F | Wt 139.0 lb

## 2016-05-08 DIAGNOSIS — Z331 Pregnant state, incidental: Secondary | ICD-10-CM

## 2016-05-08 DIAGNOSIS — Z1389 Encounter for screening for other disorder: Secondary | ICD-10-CM

## 2016-05-08 DIAGNOSIS — Z3482 Encounter for supervision of other normal pregnancy, second trimester: Secondary | ICD-10-CM | POA: Diagnosis not present

## 2016-05-08 LAB — POCT URINALYSIS DIPSTICK
Bilirubin, UA: NEGATIVE
Blood, UA: NEGATIVE
Glucose, UA: NEGATIVE
Ketones, UA: NEGATIVE
NITRITE UA: NEGATIVE
PH UA: 6
PROTEIN UA: NEGATIVE
Spec Grav, UA: 1.02
Urobilinogen, UA: NEGATIVE

## 2016-05-08 NOTE — Progress Notes (Signed)
Subjective:    Allison Long is a 25 y.o. female being seen today for her obstetrical visit. She is at [redacted]w[redacted]d gestation. Patient reports: fatigue, heartburn, no bleeding, no contractions, no cramping and no leaking .  Patients reports heartburn relieved by Tums. Fetal movement: normal.  Problem List Items Addressed This Visit    None    Visit Diagnoses   None.    Patient Active Problem List   Diagnosis Date Noted  . Supervision of other normal pregnancy, antepartum 04/04/2016  . Late prenatal care affecting pregnancy in second trimester, antepartum 04/04/2016  . Active labor at term 08/19/2014  . NVD (normal vaginal delivery) 08/19/2014  . Normal delivery 09/29/2011   Objective:    LMP 11/29/2015 (LMP Unknown)  FHT: 148 BPM  Uterine Size: size equals dates     Assessment:    Pregnancy @ [redacted]w[redacted]d    Plan:    OBGCT: ordered for next visit. Signs and symptoms of preterm labor: discussed.  Labs, problem list reviewed and updated 2 hr GTT planned for next visit Follow up in 4 weeks.

## 2016-06-05 ENCOUNTER — Encounter: Payer: Self-pay | Admitting: Obstetrics

## 2016-06-05 ENCOUNTER — Ambulatory Visit (INDEPENDENT_AMBULATORY_CARE_PROVIDER_SITE_OTHER): Payer: Medicaid Other | Admitting: Obstetrics

## 2016-06-05 ENCOUNTER — Other Ambulatory Visit: Payer: Medicaid Other

## 2016-06-05 VITALS — BP 98/64 | HR 89 | Wt 144.0 lb

## 2016-06-05 DIAGNOSIS — Z331 Pregnant state, incidental: Secondary | ICD-10-CM

## 2016-06-05 DIAGNOSIS — Z3482 Encounter for supervision of other normal pregnancy, second trimester: Secondary | ICD-10-CM | POA: Diagnosis not present

## 2016-06-05 DIAGNOSIS — Z1389 Encounter for screening for other disorder: Secondary | ICD-10-CM | POA: Diagnosis not present

## 2016-06-05 DIAGNOSIS — K219 Gastro-esophageal reflux disease without esophagitis: Secondary | ICD-10-CM

## 2016-06-05 LAB — POCT URINALYSIS DIPSTICK
BILIRUBIN UA: NEGATIVE
GLUCOSE UA: NEGATIVE
Ketones, UA: NEGATIVE
Leukocytes, UA: NEGATIVE
NITRITE UA: NEGATIVE
PH UA: 5
Protein, UA: NEGATIVE
RBC UA: NEGATIVE
SPEC GRAV UA: 1.025
UROBILINOGEN UA: NEGATIVE

## 2016-06-05 MED ORDER — OMEPRAZOLE 20 MG PO CPDR: 20.0000 mg | DELAYED_RELEASE_CAPSULE | Freq: Every day | ORAL | 5 refills | Status: DC

## 2016-06-05 NOTE — Progress Notes (Signed)
Subjective:    Allison Long is a 25 y.o. female being seen today for her obstetrical visit. She is at 7566w0d gestation. Patient reports: no complaints . Fetal movement: normal.  Problem List Items Addressed This Visit    None    Visit Diagnoses    Encounter for supervision of other normal pregnancy in second trimester    -  Primary   Relevant Orders   Glucose Tolerance, 2 Hours w/1 Hour   CBC   HIV antibody   RPR   POCT urinalysis dipstick (Completed)   GERD without esophagitis       Relevant Medications   omeprazole (PRILOSEC) 20 MG capsule     Patient Active Problem List   Diagnosis Date Noted  . Supervision of other normal pregnancy, antepartum 04/04/2016  . Late prenatal care affecting pregnancy in second trimester, antepartum 04/04/2016  . Active labor at term 08/19/2014  . NVD (normal vaginal delivery) 08/19/2014  . Normal delivery 09/29/2011   Objective:    BP 98/64   Pulse 89   Wt 144 lb (65.3 kg)   LMP 11/29/2015 (LMP Unknown)   BMI 28.12 kg/m  FHT: 150 BPM  Uterine Size: size equals dates     Assessment:    Pregnancy @ 9366w0d    Plan:    OBGCT: ordered. Signs and symptoms of preterm labor: discussed.  Labs, problem list reviewed and updated 2 hr GTT planned Follow up in 2 weeks.

## 2016-06-06 LAB — CBC
HEMATOCRIT: 30.9 % — AB (ref 34.0–46.6)
HEMOGLOBIN: 10.6 g/dL — AB (ref 11.1–15.9)
MCH: 31.2 pg (ref 26.6–33.0)
MCHC: 34.3 g/dL (ref 31.5–35.7)
MCV: 91 fL (ref 79–97)
PLATELETS: 258 10*3/uL (ref 150–379)
RBC: 3.4 x10E6/uL — AB (ref 3.77–5.28)
RDW: 12.4 % (ref 12.3–15.4)
WBC: 7.8 10*3/uL (ref 3.4–10.8)

## 2016-06-06 LAB — GLUCOSE TOLERANCE, 2 HOURS W/ 1HR
Glucose, 1 hour: 107 mg/dL (ref 65–179)
Glucose, 2 hour: 101 mg/dL (ref 65–152)
Glucose, Fasting: 78 mg/dL (ref 65–91)

## 2016-06-06 LAB — HIV ANTIBODY (ROUTINE TESTING W REFLEX): HIV Screen 4th Generation wRfx: NONREACTIVE

## 2016-06-06 LAB — RPR: RPR Ser Ql: NONREACTIVE

## 2016-06-20 ENCOUNTER — Encounter: Payer: Medicaid Other | Admitting: Obstetrics

## 2016-07-03 ENCOUNTER — Encounter: Payer: Self-pay | Admitting: Obstetrics and Gynecology

## 2016-07-10 ENCOUNTER — Encounter: Payer: Self-pay | Admitting: *Deleted

## 2016-08-06 ENCOUNTER — Ambulatory Visit (INDEPENDENT_AMBULATORY_CARE_PROVIDER_SITE_OTHER): Payer: Medicaid Other | Admitting: Family

## 2016-08-06 VITALS — BP 109/72 | HR 93 | Wt 152.0 lb

## 2016-08-06 DIAGNOSIS — Z3483 Encounter for supervision of other normal pregnancy, third trimester: Secondary | ICD-10-CM

## 2016-08-06 DIAGNOSIS — Z3480 Encounter for supervision of other normal pregnancy, unspecified trimester: Secondary | ICD-10-CM

## 2016-08-06 DIAGNOSIS — Z23 Encounter for immunization: Secondary | ICD-10-CM | POA: Diagnosis not present

## 2016-08-06 LAB — OB RESULTS CONSOLE GBS: GBS: POSITIVE

## 2016-08-06 NOTE — Progress Notes (Signed)
Pt states that she may have a vaginal yeast infection.

## 2016-08-06 NOTE — Progress Notes (Signed)
   PRENATAL VISIT NOTE  Subjective:  Allison Long is a 25 y.o. G3P2002 at 2870w6d being seen today for ongoing prenatal care.  She is currently monitored for the following issues for this low-risk pregnancy and has Supervision of other normal pregnancy, antepartum and Late prenatal care affecting pregnancy in second trimester, antepartum on her problem list.  Patient reports vaginal irritation.  Contractions: Not present. Vag. Bleeding: None.  Movement: Present. Denies leaking of fluid.   The following portions of the patient's history were reviewed and updated as appropriate: allergies, current medications, past family history, past medical history, past social history, past surgical history and problem list. Problem list updated.  Objective:   Vitals:   08/06/16 1529  BP: 109/72  Pulse: 93  Weight: 152 lb (68.9 kg)    Fetal Status: Fetal Heart Rate (bpm): 136 Fundal Height: 35 cm Movement: Present  Presentation: Vertex  General:  Alert, oriented and cooperative. Patient is in no acute distress.  Skin: Skin is warm and dry. No rash noted.   Cardiovascular: Normal heart rate noted  Respiratory: Normal respiratory effort, no problems with respiration noted  Abdomen: Soft, gravid, appropriate for gestational age. Pain/Pressure: Present     Pelvic:  Cervical exam performed Dilation: Closed Effacement (%): Thick    Extremities: Normal range of motion.  Edema: Trace  Mental Status: Normal mood and affect. Normal behavior. Normal judgment and thought content.   Assessment and Plan:  Pregnancy: G3P2002 at 5370w6d  1. Supervision of other normal pregnancy, antepartum - Strep Gp B NAA - GC/CT - Tdap vaccine greater than or equal to 7yo IM  2. Vaginal Discharge - Wet prep  Preterm labor symptoms and general obstetric precautions including but not limited to vaginal bleeding, contractions, leaking of fluid and fetal movement were reviewed in detail with the patient. Please refer to  After Visit Summary for other counseling recommendations.  Return in 1 week (on 08/13/2016).  Eino FarberWalidah Kennith GainN Karim, CNM

## 2016-08-06 NOTE — Patient Instructions (Signed)

## 2016-08-06 NOTE — Addendum Note (Signed)
Addended by: Marya LandryFOSTER, SUZANNE D on: 08/06/2016 04:05 PM   Modules accepted: Orders

## 2016-08-08 LAB — VAGINITIS/VAGINOSIS, DNA PROBE
Candida Species: POSITIVE — AB
Gardnerella vaginalis: NEGATIVE
TRICHOMONAS VAG: NEGATIVE

## 2016-08-08 LAB — STREP GP B NAA: STREP GROUP B AG: POSITIVE — AB

## 2016-08-10 ENCOUNTER — Other Ambulatory Visit: Payer: Self-pay | Admitting: Family

## 2016-08-10 DIAGNOSIS — B379 Candidiasis, unspecified: Secondary | ICD-10-CM

## 2016-08-10 MED ORDER — TERCONAZOLE 0.4 % VA CREA: 1.0000 | TOPICAL_CREAM | Freq: Every day | VAGINAL | 0 refills | Status: DC

## 2016-08-10 NOTE — Progress Notes (Signed)
Left message regarding RX for yeast infection being sent to pharmacy.

## 2016-08-13 ENCOUNTER — Ambulatory Visit (INDEPENDENT_AMBULATORY_CARE_PROVIDER_SITE_OTHER): Payer: Medicaid Other | Admitting: Obstetrics

## 2016-08-13 ENCOUNTER — Encounter: Payer: Self-pay | Admitting: Obstetrics

## 2016-08-13 VITALS — BP 106/66 | HR 119 | Temp 97.6°F | Wt 157.0 lb

## 2016-08-13 DIAGNOSIS — Z3403 Encounter for supervision of normal first pregnancy, third trimester: Secondary | ICD-10-CM | POA: Diagnosis not present

## 2016-08-13 NOTE — Progress Notes (Signed)
Patient reports she is doing well 

## 2016-08-13 NOTE — Progress Notes (Signed)
    Allison BeetsJasmine L Cullop is a 25 y.o. female being seen today for her obstetrical visit. She is at 2877w6d gestation. Patient reports no complaints. Fetal movement: normal.  Problem List Items Addressed This Visit    None    Visit Diagnoses   None.    Patient Active Problem List   Diagnosis Date Noted  . Supervision of other normal pregnancy, antepartum 04/04/2016  . Late prenatal care affecting pregnancy in second trimester, antepartum 04/04/2016   Objective:    BP 106/66   Pulse (!) 119   Temp 97.6 F (36.4 C)   Wt 157 lb (71.2 kg)   LMP 11/29/2015 (LMP Unknown)   BMI 30.66 kg/m  FHT:  150 BPM  Uterine Size: size equals dates  Presentation: unsure     Assessment:    Pregnancy @ 877w6d weeks   Plan:     labs reviewed, problem list updated Consent signed. GBS sent TDAP offered  Rhogam given for RH negative Pediatrician: discussed. Infant feeding: plans to breastfeed. Maternity leave: discussed. Cigarette smoking: smokes 0.5 PPD. No orders of the defined types were placed in this encounter.  No orders of the defined types were placed in this encounter.  Follow up in 1 Week.     Patient ID: Allison BeetsJasmine L Deschepper, female   DOB: 05/14/1991, 25 y.o.   MRN: [id number]

## 2016-08-20 ENCOUNTER — Encounter: Payer: Medicaid Other | Admitting: Obstetrics

## 2016-08-29 ENCOUNTER — Encounter: Payer: Medicaid Other | Admitting: Certified Nurse Midwife

## 2016-09-03 ENCOUNTER — Other Ambulatory Visit: Payer: Self-pay | Admitting: Certified Nurse Midwife

## 2016-09-03 ENCOUNTER — Ambulatory Visit (INDEPENDENT_AMBULATORY_CARE_PROVIDER_SITE_OTHER): Payer: Medicaid Other | Admitting: Certified Nurse Midwife

## 2016-09-03 VITALS — BP 119/77 | HR 109 | Wt 156.0 lb

## 2016-09-03 DIAGNOSIS — Z3403 Encounter for supervision of normal first pregnancy, third trimester: Secondary | ICD-10-CM | POA: Diagnosis not present

## 2016-09-03 NOTE — Progress Notes (Signed)
Subjective:    Allison Long is a 25 y.o. female being seen today for her obstetrical visit. She is at 4454w6d gestation. Patient reports no complaints. Fetal movement: normal.  Problem List Items Addressed This Visit    None     Patient Active Problem List   Diagnosis Date Noted  . Supervision of other normal pregnancy, antepartum 04/04/2016  . Late prenatal care affecting pregnancy in second trimester, antepartum 04/04/2016    Objective:    BP 119/77   Pulse (!) 109   Wt 156 lb (70.8 kg)   LMP 11/29/2015 (LMP Unknown)   BMI 30.47 kg/m  FHT: 150 BPM  Uterine Size: 40 cm and size equals dates  Presentations: cephalic  Pelvic Exam:              Dilation: 1cm       Effacement: Long             Station:  -3    Consistency: firm            Position: posterior    NST: + accels, no decels, moderate variability, Cat. 1 tracing. No contractions on toco.   Assessment:    Pregnancy @ 154w6d weeks   Reactive NST  Plan:   Plans for delivery: Vaginal anticipated; labs reviewed; problem list updated Counseling: Consent signed. Infant feeding: plans to breastfeed. Cigarette smoking: never smoked. L&D discussion: symptoms of labor, discussed when to call, discussed what number to call, anesthetic/analgesic options reviewed and delivering clinician:  plans no preference. Postpartum supports and preparation: circumcision discussed and contraception plans discussed. IOL scheduled for 09/11/16 @0730  Follow up in 1 Week.

## 2016-09-04 ENCOUNTER — Telehealth (HOSPITAL_COMMUNITY): Payer: Self-pay | Admitting: *Deleted

## 2016-09-04 NOTE — Telephone Encounter (Signed)
Preadmission screen  

## 2016-09-06 ENCOUNTER — Inpatient Hospital Stay (HOSPITAL_COMMUNITY): Payer: Medicaid Other | Admitting: Anesthesiology

## 2016-09-06 ENCOUNTER — Encounter (HOSPITAL_COMMUNITY): Payer: Self-pay

## 2016-09-06 ENCOUNTER — Inpatient Hospital Stay (HOSPITAL_COMMUNITY)
Admission: AD | Admit: 2016-09-06 | Discharge: 2016-09-08 | DRG: 775 | Disposition: A | Discharge status: Home / Self Care | Payer: Medicaid Other | Source: Ambulatory Visit | Attending: Obstetrics and Gynecology | Admitting: Obstetrics and Gynecology

## 2016-09-06 DIAGNOSIS — O99824 Streptococcus B carrier state complicating childbirth: Secondary | ICD-10-CM | POA: Diagnosis present

## 2016-09-06 DIAGNOSIS — Z3A40 40 weeks gestation of pregnancy: Secondary | ICD-10-CM

## 2016-09-06 DIAGNOSIS — O26893 Other specified pregnancy related conditions, third trimester: Secondary | ICD-10-CM | POA: Diagnosis present

## 2016-09-06 DIAGNOSIS — F17210 Nicotine dependence, cigarettes, uncomplicated: Secondary | ICD-10-CM | POA: Diagnosis present

## 2016-09-06 DIAGNOSIS — Z6791 Unspecified blood type, Rh negative: Secondary | ICD-10-CM | POA: Diagnosis not present

## 2016-09-06 DIAGNOSIS — Z3493 Encounter for supervision of normal pregnancy, unspecified, third trimester: Secondary | ICD-10-CM | POA: Diagnosis present

## 2016-09-06 DIAGNOSIS — O99334 Smoking (tobacco) complicating childbirth: Secondary | ICD-10-CM | POA: Diagnosis present

## 2016-09-06 DIAGNOSIS — IMO0002 Reserved for concepts with insufficient information to code with codable children: Secondary | ICD-10-CM | POA: Diagnosis present

## 2016-09-06 DIAGNOSIS — O479 False labor, unspecified: Secondary | ICD-10-CM | POA: Diagnosis present

## 2016-09-06 LAB — RAPID URINE DRUG SCREEN, HOSP PERFORMED
AMPHETAMINES: NOT DETECTED
Barbiturates: NOT DETECTED
Benzodiazepines: NOT DETECTED
COCAINE: NOT DETECTED
OPIATES: NOT DETECTED
TETRAHYDROCANNABINOL: NOT DETECTED

## 2016-09-06 LAB — TYPE AND SCREEN
ABO/RH(D): O POS
Antibody Screen: NEGATIVE

## 2016-09-06 LAB — COMPREHENSIVE METABOLIC PANEL
ALBUMIN: 3.1 g/dL — AB (ref 3.5–5.0)
ALT: 11 U/L — ABNORMAL LOW (ref 14–54)
ANION GAP: 7 (ref 5–15)
AST: 18 U/L (ref 15–41)
Alkaline Phosphatase: 198 U/L — ABNORMAL HIGH (ref 38–126)
BUN: 9 mg/dL (ref 6–20)
CHLORIDE: 107 mmol/L (ref 101–111)
CO2: 20 mmol/L — ABNORMAL LOW (ref 22–32)
Calcium: 8.9 mg/dL (ref 8.9–10.3)
Creatinine, Ser: 0.57 mg/dL (ref 0.44–1.00)
GFR calc Af Amer: >60 mL/min (ref >60)
GFR calc non Af Amer: >60 mL/min (ref >60)
GLUCOSE: 75 mg/dL (ref 65–99)
POTASSIUM: 4 mmol/L (ref 3.5–5.1)
SODIUM: 134 mmol/L — AB (ref 135–145)
TOTAL PROTEIN: 6.5 g/dL (ref 6.5–8.1)
Total Bilirubin: 0.4 mg/dL (ref 0.3–1.2)

## 2016-09-06 LAB — URINALYSIS, ROUTINE W REFLEX MICROSCOPIC
BILIRUBIN URINE: NEGATIVE
Glucose, UA: NEGATIVE mg/dL
HGB URINE DIPSTICK: NEGATIVE
Ketones, ur: NEGATIVE mg/dL
Leukocytes, UA: NEGATIVE
NITRITE: NEGATIVE
PROTEIN: NEGATIVE mg/dL
pH: 7 (ref 5.0–8.0)

## 2016-09-06 LAB — CBC
HEMATOCRIT: 29.2 % — AB (ref 36.0–46.0)
HEMOGLOBIN: 9.9 g/dL — AB (ref 12.0–15.0)
MCH: 28.1 pg (ref 26.0–34.0)
MCHC: 33.9 g/dL (ref 30.0–36.0)
MCV: 83 fL (ref 78.0–100.0)
Platelets: 303 10*3/uL (ref 150–400)
RBC: 3.52 MIL/uL — AB (ref 3.87–5.11)
RDW: 13 % (ref 11.5–15.5)
WBC: 6.9 10*3/uL (ref 4.0–10.5)

## 2016-09-06 MED ORDER — WITCH HAZEL-GLYCERIN EX PADS: 1.0000 "application " | MEDICATED_PAD | CUTANEOUS | Status: DC | PRN

## 2016-09-06 MED ORDER — DEXTROSE 5 % IV SOLN
5.0000 10*6.[IU] | Freq: Once | INTRAVENOUS | Status: AC
  Administered 2016-09-06: 5 10*6.[IU] via INTRAVENOUS
  Filled 2016-09-06: qty 5

## 2016-09-06 MED ORDER — PENICILLIN G POTASSIUM 5000000 UNITS IJ SOLR
5.0000 10*6.[IU] | Freq: Once | INTRAVENOUS | Status: DC
  Filled 2016-09-06: qty 5

## 2016-09-06 MED ORDER — PHENYLEPHRINE 40 MCG/ML (10ML) SYRINGE FOR IV PUSH (FOR BLOOD PRESSURE SUPPORT)
80.0000 ug | PREFILLED_SYRINGE | INTRAVENOUS | Status: DC | PRN
  Filled 2016-09-06: qty 5

## 2016-09-06 MED ORDER — LIDOCAINE HCL (PF) 1 % IJ SOLN
30.0000 mL | INTRAMUSCULAR | Status: DC | PRN
  Filled 2016-09-06: qty 30

## 2016-09-06 MED ORDER — DOCUSATE SODIUM 100 MG PO CAPS
100.0000 mg | ORAL_CAPSULE | Freq: Two times a day (BID) | ORAL | Status: DC
  Administered 2016-09-06 – 2016-09-08 (×4): 100 mg via ORAL
  Filled 2016-09-06 (×4): qty 1

## 2016-09-06 MED ORDER — TETANUS-DIPHTH-ACELL PERTUSSIS 5-2.5-18.5 LF-MCG/0.5 IM SUSP: 0.5000 mL | Freq: Once | INTRAMUSCULAR | Status: DC

## 2016-09-06 MED ORDER — ONDANSETRON HCL 4 MG PO TABS: 4.0000 mg | ORAL_TABLET | ORAL | Status: DC | PRN

## 2016-09-06 MED ORDER — BISACODYL 10 MG RE SUPP: 10.0000 mg | Freq: Every day | RECTAL | Status: DC | PRN

## 2016-09-06 MED ORDER — ONDANSETRON HCL 4 MG/2ML IJ SOLN: 4.0000 mg | INTRAMUSCULAR | Status: DC | PRN

## 2016-09-06 MED ORDER — FLEET ENEMA 7-19 GM/118ML RE ENEM: 1.0000 | ENEMA | Freq: Every day | RECTAL | Status: DC | PRN

## 2016-09-06 MED ORDER — OXYCODONE HCL 5 MG PO TABS: 10.0000 mg | ORAL_TABLET | ORAL | Status: DC | PRN

## 2016-09-06 MED ORDER — MEASLES, MUMPS & RUBELLA VAC ~~LOC~~ INJ
0.5000 mL | INJECTION | Freq: Once | SUBCUTANEOUS | Status: DC
  Filled 2016-09-06: qty 0.5

## 2016-09-06 MED ORDER — PENICILLIN G POT IN DEXTROSE 60000 UNIT/ML IV SOLN
3.0000 10*6.[IU] | INTRAVENOUS | Status: DC
  Filled 2016-09-06 (×4): qty 50

## 2016-09-06 MED ORDER — ACETAMINOPHEN 325 MG PO TABS: 650.0000 mg | ORAL_TABLET | ORAL | Status: DC | PRN

## 2016-09-06 MED ORDER — OXYTOCIN BOLUS FROM INFUSION: 500.0000 mL | Freq: Once | INTRAVENOUS | Status: DC

## 2016-09-06 MED ORDER — METHYLERGONOVINE MALEATE 0.2 MG/ML IJ SOLN: 0.2000 mg | INTRAMUSCULAR | Status: DC | PRN

## 2016-09-06 MED ORDER — SOD CITRATE-CITRIC ACID 500-334 MG/5ML PO SOLN: 30.0000 mL | ORAL | Status: DC | PRN

## 2016-09-06 MED ORDER — LACTATED RINGERS IV SOLN: 500.0000 mL | Freq: Once | INTRAVENOUS | Status: DC

## 2016-09-06 MED ORDER — ONDANSETRON HCL 4 MG/2ML IJ SOLN: 4.0000 mg | Freq: Four times a day (QID) | INTRAMUSCULAR | Status: DC | PRN

## 2016-09-06 MED ORDER — ZOLPIDEM TARTRATE 5 MG PO TABS: 5.0000 mg | ORAL_TABLET | Freq: Every evening | ORAL | Status: DC | PRN

## 2016-09-06 MED ORDER — LACTATED RINGERS IV SOLN: 500.0000 mL | INTRAVENOUS | Status: DC | PRN

## 2016-09-06 MED ORDER — PENICILLIN G POTASSIUM 5000000 UNITS IJ SOLR: 5.0000 10*6.[IU] | Freq: Once | INTRAVENOUS | Status: DC

## 2016-09-06 MED ORDER — MISOPROSTOL 25 MCG QUARTER TABLET
25.0000 ug | ORAL_TABLET | ORAL | Status: DC | PRN
  Filled 2016-09-06: qty 1

## 2016-09-06 MED ORDER — FENTANYL 2.5 MCG/ML BUPIVACAINE 1/10 % EPIDURAL INFUSION (WH - ANES)
INTRAMUSCULAR | Status: AC
  Filled 2016-09-06: qty 100

## 2016-09-06 MED ORDER — PRENATAL MULTIVITAMIN CH
1.0000 | ORAL_TABLET | Freq: Every day | ORAL | Status: DC
  Administered 2016-09-06 – 2016-09-08 (×3): 1 via ORAL
  Filled 2016-09-06 (×3): qty 1

## 2016-09-06 MED ORDER — BENZOCAINE-MENTHOL 20-0.5 % EX AERO
1.0000 | INHALATION_SPRAY | CUTANEOUS | Status: DC | PRN
Start: 2016-09-06 — End: 2016-09-08

## 2016-09-06 MED ORDER — FENTANYL CITRATE (PF) 100 MCG/2ML IJ SOLN: 50.0000 ug | INTRAMUSCULAR | Status: DC | PRN

## 2016-09-06 MED ORDER — OXYTOCIN 40 UNITS IN LACTATED RINGERS INFUSION - SIMPLE MED
2.5000 [IU]/h | INTRAVENOUS | Status: DC
  Administered 2016-09-06: 2.5 [IU]/h via INTRAVENOUS
  Filled 2016-09-06: qty 1000

## 2016-09-06 MED ORDER — LIDOCAINE HCL (PF) 1 % IJ SOLN: 30.0000 mL | INTRAMUSCULAR | Status: DC | PRN

## 2016-09-06 MED ORDER — EPHEDRINE 5 MG/ML INJ
10.0000 mg | INTRAVENOUS | Status: DC | PRN
  Filled 2016-09-06: qty 4

## 2016-09-06 MED ORDER — OXYCODONE HCL 5 MG PO TABS: 5.0000 mg | ORAL_TABLET | ORAL | Status: DC | PRN

## 2016-09-06 MED ORDER — LIDOCAINE HCL (PF) 1 % IJ SOLN
INTRAMUSCULAR | Status: DC | PRN
  Administered 2016-09-06 (×2): 7 mL via EPIDURAL

## 2016-09-06 MED ORDER — FENTANYL CITRATE (PF) 100 MCG/2ML IJ SOLN: 100.0000 ug | INTRAMUSCULAR | Status: DC | PRN

## 2016-09-06 MED ORDER — PENICILLIN G POT IN DEXTROSE 60000 UNIT/ML IV SOLN: 3.0000 10*6.[IU] | INTRAVENOUS | Status: DC

## 2016-09-06 MED ORDER — OXYTOCIN 40 UNITS IN LACTATED RINGERS INFUSION - SIMPLE MED: 2.5000 [IU]/h | INTRAVENOUS | Status: DC

## 2016-09-06 MED ORDER — OXYCODONE-ACETAMINOPHEN 5-325 MG PO TABS: 1.0000 | ORAL_TABLET | ORAL | Status: DC | PRN

## 2016-09-06 MED ORDER — SIMETHICONE 80 MG PO CHEW
80.0000 mg | CHEWABLE_TABLET | ORAL | Status: DC | PRN
Start: 2016-09-06 — End: 2016-09-08

## 2016-09-06 MED ORDER — TERBUTALINE SULFATE 1 MG/ML IJ SOLN
0.2500 mg | Freq: Once | INTRAMUSCULAR | Status: DC | PRN
  Filled 2016-09-06: qty 1

## 2016-09-06 MED ORDER — DIPHENHYDRAMINE HCL 50 MG/ML IJ SOLN: 12.5000 mg | INTRAMUSCULAR | Status: DC | PRN

## 2016-09-06 MED ORDER — IBUPROFEN 600 MG PO TABS
600.0000 mg | ORAL_TABLET | Freq: Four times a day (QID) | ORAL | Status: DC
  Administered 2016-09-06 – 2016-09-08 (×8): 600 mg via ORAL
  Filled 2016-09-06 (×7): qty 1

## 2016-09-06 MED ORDER — FLEET ENEMA 7-19 GM/118ML RE ENEM: 1.0000 | ENEMA | RECTAL | Status: DC | PRN

## 2016-09-06 MED ORDER — OXYCODONE-ACETAMINOPHEN 5-325 MG PO TABS: 2.0000 | ORAL_TABLET | ORAL | Status: DC | PRN

## 2016-09-06 MED ORDER — PENICILLIN G POT IN DEXTROSE 60000 UNIT/ML IV SOLN
3.0000 10*6.[IU] | INTRAVENOUS | Status: DC
  Filled 2016-09-06: qty 50

## 2016-09-06 MED ORDER — SODIUM CHLORIDE 0.9 % IV SOLN
2.0000 g | Freq: Once | INTRAVENOUS | Status: AC
  Administered 2016-09-06: 2 g via INTRAVENOUS
  Filled 2016-09-06: qty 2000

## 2016-09-06 MED ORDER — DIPHENHYDRAMINE HCL 25 MG PO CAPS: 25.0000 mg | ORAL_CAPSULE | Freq: Four times a day (QID) | ORAL | Status: DC | PRN

## 2016-09-06 MED ORDER — PHENYLEPHRINE 40 MCG/ML (10ML) SYRINGE FOR IV PUSH (FOR BLOOD PRESSURE SUPPORT)
PREFILLED_SYRINGE | INTRAVENOUS | Status: AC
  Filled 2016-09-06: qty 20

## 2016-09-06 MED ORDER — FERROUS SULFATE 325 (65 FE) MG PO TABS
325.0000 mg | ORAL_TABLET | Freq: Two times a day (BID) | ORAL | Status: DC
  Administered 2016-09-06 – 2016-09-08 (×4): 325 mg via ORAL
  Filled 2016-09-06 (×4): qty 1

## 2016-09-06 MED ORDER — LACTATED RINGERS IV SOLN: INTRAVENOUS | Status: DC

## 2016-09-06 MED ORDER — OXYTOCIN 10 UNIT/ML IJ SOLN: 10.0000 [IU] | Freq: Once | INTRAMUSCULAR | Status: DC

## 2016-09-06 MED ORDER — COCONUT OIL OIL: 1.0000 "application " | TOPICAL_OIL | Status: DC | PRN

## 2016-09-06 MED ORDER — LACTATED RINGERS IV SOLN
INTRAVENOUS | Status: DC
  Administered 2016-09-06: 08:00:00 via INTRAVENOUS

## 2016-09-06 MED ORDER — LACTATED RINGERS IV SOLN
500.0000 mL | INTRAVENOUS | Status: DC | PRN
  Administered 2016-09-06 (×2): 500 mL via INTRAVENOUS

## 2016-09-06 MED ORDER — METHYLERGONOVINE MALEATE 0.2 MG PO TABS: 0.2000 mg | ORAL_TABLET | ORAL | Status: DC | PRN

## 2016-09-06 MED ORDER — FENTANYL 2.5 MCG/ML BUPIVACAINE 1/10 % EPIDURAL INFUSION (WH - ANES)
14.0000 mL/h | INTRAMUSCULAR | Status: DC | PRN
  Administered 2016-09-06 (×2): 14 mL/h via EPIDURAL

## 2016-09-06 MED ORDER — DIBUCAINE 1 % RE OINT: 1.0000 "application " | TOPICAL_OINTMENT | RECTAL | Status: DC | PRN

## 2016-09-06 NOTE — H&P (Signed)
LABOR ADMISSION HISTORY AND PHYSICAL  Allison Long is a 25 y.o. female 443P2002 with IUP at 268w2d by LMP presenting for SOL. She reports +FM, + contractions that became more frequent and intense overnight, No LOF, no VB, no blurry vision, headaches or peripheral edema, and RUQ pain.  She plans on bottle feeding. She requests depo for birth control.  Dating: By LMP --->  Estimated Date of Delivery: 09/04/16  Sono:    @[redacted]w[redacted]d , CWD, normal anatomy, breech presentation, transvers lie, 294 g, 55% EFW   Prenatal History/Complications: GBS + Charted that she received rhogam 08/13/16 for RH negative but is blood type O pos and antibody negative. BV and yeast (treated) Late to prenatal care  Past Medical History: Past Medical History:  Diagnosis Date  . Anemia   . Chlamydia    x 2  Chlamydia infections appear to have been from a few years ago.  Past Surgical History: Past Surgical History:  Procedure Laterality Date  . NO PAST SURGERIES      Obstetrical History: OB History    Gravida Para Term Preterm AB Living   3 2 2  0 0 2   SAB TAB Ectopic Multiple Live Births   0 0 0 0 2      Social History: Social History   Social History  . Marital status: Single    Spouse name: N/A  . Number of children: N/A  . Years of education: N/A   Social History Main Topics  . Smoking status: Current Every Day Smoker    Packs/day: 0.50    Types: Cigarettes  . Smokeless tobacco: Never Used     Comment: not during pregnancy  . Alcohol use Yes     Comment: occasional  . Drug use:     Frequency: 7.0 times per week    Types: Marijuana     Comment: none during pregnancy  . Sexual activity: Yes    Birth control/ protection: None   Other Topics Concern  . None   Social History Narrative  . None    Family History: Family History  Problem Relation Age of Onset  . Thyroid disease Mother   . Cancer Father     colon    Allergies: No Known Allergies  Prescriptions Prior to  Admission  Medication Sig Dispense Refill Last Dose  . omeprazole (PRILOSEC) 20 MG capsule Take 1 capsule (20 mg total) by mouth daily. (Patient not taking: Reported on 08/13/2016) 60 capsule 5 Not Taking  . Prenatal Vit-Fe Fumarate-FA (PRENATAL VITAMIN) 27-0.8 MG TABS Take 1 tablet by mouth daily. (Patient not taking: Reported on 08/13/2016) 30 tablet 1 Not Taking  . Prenatal Vit-Fe Phos-FA-Omega (VITAFOL GUMMIES) 3.33-0.333-34.8 MG CHEW Chew 3 tablets by mouth daily. 90 tablet 12 Taking  . terconazole (TERAZOL 7) 0.4 % vaginal cream Place 1 applicator vaginally at bedtime. 45 g 0 Taking     Review of Systems   All systems reviewed and negative except as stated in HPI  BP 123/77   Pulse 67   Temp 97.3 F (36.3 C) (Oral)   Resp 18   Ht 5' (1.524 m)   Wt 71.7 kg (158 lb)   LMP 11/29/2015 (LMP Unknown)   BMI 30.86 kg/m  General appearance: alert, cooperative and appears stated age Lungs: clear to auscultation bilaterally Heart: regular rate and rhythm Abdomen: soft, non-tender; bowel sounds normal Extremities: Homans sign is negative, no sign of DVT, edema Presentation: cephalic Fetal monitoringBaseline: 140 bpm, Variability: Good {> 6  bpm), Accelerations: Reactive and Decelerations: Variable: moderate Uterine activityFrequency: Every 2-3 minutes Dilation: 7 Effacement (%): 100 Station: -2, -3 Exam by:: [email or web address]   Prenatal labs: ABO, Rh: --/--/O POS (11/23 16100642) Antibody: NEG (11/23 0642) Rubella: Immune RPR: Non Reactive (08/22 1100)  HBsAg: Negative (06/20 1540)  HIV: Non Reactive (08/22 1100)  GBS: Positive (10/23 1627)  1 hr Glucola: 107 Genetic screening: N/A Anatomy US: nml  Prenatal Transfer Tool  Maternal Diabetes: No Genetic Screening: Declined Maternal Ultrasounds/Referrals: Normal Fetal Ultrasounds or other Referrals:  None Maternal Substance Abuse:  No Significant Maternal Medications:  None Significant Maternal Lab Results: Lab values include:  Group B Strep positive  Results for orders placed or performed during the hospital encounter of 09/06/16 (from the past 24 hour(s))  CBC   Collection Time: 09/06/16  6:40 AM  Result Value Ref Range   WBC 6.9 4.0 - 10.5 K/uL   RBC 3.52 (L) 3.87 - 5.11 MIL/uL   Hemoglobin 9.9 (L) 12.0 - 15.0 g/dL   HCT 96.029.2 (L) 45.436.0 - 09.846.0 %   MCV 83.0 78.0 - 100.0 fL   MCH 28.1 26.0 - 34.0 pg   MCHC 33.9 30.0 - 36.0 g/dL   RDW 11.913.0 14.711.5 - 82.915.5 %   Platelets 303 150 - 400 K/uL  Comprehensive metabolic panel   Collection Time: 09/06/16  6:40 AM  Result Value Ref Range   Sodium 134 (L) 135 - 145 mmol/L   Potassium 4.0 3.5 - 5.1 mmol/L   Chloride 107 101 - 111 mmol/L   CO2 20 (L) 22 - 32 mmol/L   Glucose, Bld 75 65 - 99 mg/dL   BUN 9 6 - 20 mg/dL   Creatinine, Ser 5.620.57 0.44 - 1.00 mg/dL   Calcium 8.9 8.9 - 13.010.3 mg/dL   Total Protein 6.5 6.5 - 8.1 g/dL   Albumin 3.1 (L) 3.5 - 5.0 g/dL   AST 18 15 - 41 U/L   ALT 11 (L) 14 - 54 U/L   Alkaline Phosphatase 198 (H) 38 - 126 U/L   Total Bilirubin 0.4 0.3 - 1.2 mg/dL   GFR calc non Af Amer >60 >60 mL/min   GFR calc Af Amer >60 >60 mL/min   Anion gap 7 5 - 15  Type and screen   Collection Time: 09/06/16  6:42 AM  Result Value Ref Range   ABO/RH(D) O POS    Antibody Screen NEG    Sample Expiration 09/09/2016   Urinalysis, Routine w reflex microscopic (not [facility])   Collection Time: 09/06/16  9:00 AM  Result Value Ref Range   Color, Urine YELLOW YELLOW   APPearance CLEAR CLEAR   Specific Gravity, Urine <1.005 (L) 1.005 - 1.030   pH 7.0 5.0 - 8.0   Glucose, UA NEGATIVE NEGATIVE mg/dL   Hgb urine dipstick NEGATIVE NEGATIVE   Bilirubin Urine NEGATIVE NEGATIVE   Ketones, ur NEGATIVE NEGATIVE mg/dL   Protein, ur NEGATIVE NEGATIVE mg/dL   Nitrite NEGATIVE NEGATIVE   Leukocytes, UA NEGATIVE NEGATIVE  Urine rapid drug screen (hosp performed)[facility]   Collection Time: 09/06/16  9:00 AM  Result Value Ref Range   Opiates NONE DETECTED NONE  DETECTED   Cocaine NONE DETECTED NONE DETECTED   Benzodiazepines NONE DETECTED NONE DETECTED   Amphetamines NONE DETECTED NONE DETECTED   Tetrahydrocannabinol NONE DETECTED NONE DETECTED   Barbiturates NONE DETECTED NONE DETECTED    Patient Active Problem List   Diagnosis Date Noted  . Uterine contractions during  pregnancy 09/06/2016  . Encounter for trial of labor 09/06/2016  . Supervision of other normal pregnancy, antepartum 04/04/2016  . Late prenatal care affecting pregnancy in second trimester, antepartum 04/04/2016    Assessment: Allison Long is a 24 y.o. G3P2002 at [redacted]w[redacted]d here for SOL.   #Labor: Expectant management. #Pain: Planning for epidural #FWB: Cat II (for variable) #ID:  GBS pos -- ampicillin ordered (bulging bag and multiparous)  #MOF: Bottle #MOC: Depo #Circ:  N/A (girl)  Dani Gobble, MD [name], PGY-2  I agree with the above

## 2016-09-06 NOTE — Anesthesia Preprocedure Evaluation (Signed)
Anesthesia Evaluation  Patient identified by MRN, date of birth, ID band Patient awake    Reviewed: Allergy & Precautions, H&P , NPO status , Patient's Chart, lab work & pertinent test results  Airway Mallampati: I  TM Distance: >3 FB Neck ROM: full    Dental no notable dental hx.    Pulmonary neg pulmonary ROS, Current Smoker,    Pulmonary exam normal        Cardiovascular negative cardio ROS Normal cardiovascular exam     Neuro/Psych negative neurological ROS  negative psych ROS   GI/Hepatic negative GI ROS, Neg liver ROS,   Endo/Other  negative endocrine ROS  Renal/GU negative Renal ROS     Musculoskeletal   Abdominal Normal abdominal exam  (+)   Peds  Hematology   Anesthesia Other Findings   Reproductive/Obstetrics (+) Pregnancy                             Anesthesia Physical Anesthesia Plan  ASA: II  Anesthesia Plan: Epidural   Post-op Pain Management:    Induction:   Airway Management Planned:   Additional Equipment:   Intra-op Plan:   Post-operative Plan:   Informed Consent: I have reviewed the patients History and Physical, chart, labs and discussed the procedure including the risks, benefits and alternatives for the proposed anesthesia with the patient or authorized representative who has indicated his/her understanding and acceptance.     Plan Discussed with:   Anesthesia Plan Comments:         Anesthesia Quick Evaluation

## 2016-09-06 NOTE — Progress Notes (Signed)
AROM of forebag.  Leaking clear fluid, not sure when SROM was but pt says it was while here today.  FHR Cat 1, Ctx q 5-6 minutes.  Cx 8/90/-2. Receiving 2nd dose of ABX

## 2016-09-06 NOTE — Anesthesia Procedure Notes (Signed)
Epidural Patient location during procedure: OB Start time: 09/06/2016 7:45 AM End time: 09/06/2016 7:49 AM  Staffing Anesthesiologist: Leilani AbleHATCHETT, Kainan Patty Performed: anesthesiologist   Preanesthetic Checklist Completed: patient identified, surgical consent, pre-op evaluation, timeout performed, IV checked, risks and benefits discussed and monitors and equipment checked  Epidural Patient position: sitting Prep: site prepped and draped and DuraPrep Patient monitoring: continuous pulse ox and blood pressure Approach: midline Location: L3-L4 Injection technique: LOR air  Needle:  Needle type: Tuohy  Needle gauge: 17 G Needle length: 9 cm and 9 Needle insertion depth: 5 cm cm Catheter type: closed end flexible Catheter size: 19 Gauge Catheter at skin depth: 10 cm Test dose: negative and Other  Assessment Sensory level: T9 Events: blood not aspirated, injection not painful, no injection resistance, negative IV test and no paresthesia  Additional Notes Reason for block:procedure for pain

## 2016-09-06 NOTE — Progress Notes (Signed)
UR chart review completed.  

## 2016-09-06 NOTE — MAU Note (Signed)
Pt presents via EMS with contractions every 6-347mins. Denies LOF, but some streaks of bloody mucous. +FM. 1cm on last exam.

## 2016-09-06 NOTE — Progress Notes (Signed)
Provider at bedside after deceleration. FHR tracing reviewed with MD

## 2016-09-06 NOTE — Anesthesia Procedure Notes (Signed)
Procedures

## 2016-09-06 NOTE — Anesthesia Pain Management Evaluation Note (Signed)
  [facility] Pain Management Visit Note  Patient: Fredia BeetsJasmine L Chandran, 25 y.o., female  "Hello I am a member of the anesthesia team at [facility]. We have an anesthesia team available at all times to provide care throughout the hospital, including epidural management and anesthesia for C-section. I don't know your plan for the delivery whether it a natural birth, water birth, IV sedation, nitrous supplementation, doula or epidural, but we want to meet your pain goals."   1.Was your pain managed to your expectations on prior hospitalizations?   Yes   2.What is your expectation for pain management during this hospitalization?     Epidural  3.How can we help you reach that goal?   Record the patient's initial score and the patient's pain goal.   Pain: 0  Pain Goal: 5 [facility] wants you to be able to say your pain was always managed very well.  Laban EmperorMalinova,Avraj Lindroth Hristova 09/06/2016

## 2016-09-06 NOTE — Anesthesia Postprocedure Evaluation (Signed)
Anesthesia Post Note  Patient: Fredia BeetsJasmine L Snoke  Procedure(s) Performed: * No procedures listed *  Patient location during evaluation: Mother Baby Anesthesia Type: Epidural Level of consciousness: awake Pain management: pain level controlled Vital Signs Assessment: post-procedure vital signs reviewed and stable Respiratory status: spontaneous breathing Cardiovascular status: stable Postop Assessment: no headache, no backache, epidural receding, patient able to bend at knees, no signs of nausea or vomiting and adequate PO intake Anesthetic complications: no     Last Vitals:  Vitals:   09/06/16 1412 09/06/16 1413  BP: (!) 81/53 102/66  Pulse: 93 77  Resp: 20 18  Temp:      Last Pain:  Vitals:   09/06/16 1413  TempSrc:   PainSc: 0-No pain   Pain Goal: Patients Stated Pain Goal: 0 (09/06/16 0705)               Fanny DanceMULLINS,Sharonann Malbrough

## 2016-09-06 NOTE — Progress Notes (Signed)
Notified of pt arrival in MAU and exam. Will admit to labor and delivery.  

## 2016-09-07 LAB — RPR: RPR Ser Ql: NONREACTIVE

## 2016-09-07 NOTE — Progress Notes (Signed)
Post Partum Day  Subjective: no complaints, up ad lib, voiding and tolerating PO, small lochia, plans to bottle feed, Depo-Provera  Objective: Blood pressure 105/65, pulse 82, temperature 97.9 F (36.6 C), temperature source Oral, resp. rate 18, height 5' (1.524 m), weight 71.7 kg (158 lb), last menstrual period 11/29/2015, SpO2 100 %, unknown if currently breastfeeding.  Physical Exam:  General: alert, cooperative and no distress Lochia:normal flow Chest: CTAB Heart: RRR no m/r/g Abdomen: +BS, soft, nontender,  Uterine Fundus: firm DVT Evaluation: No evidence of DVT seen on physical exam. Extremities: no edema   Recent Labs  09/06/16 0640  HGB 9.9*  HCT 29.2*    Assessment/Plan: Plan for discharge tomorrow   LOS: 1 day   CRESENZO-DISHMAN,Zaine Elsass 09/07/2016, 7:44 AM

## 2016-09-08 MED ORDER — IBUPROFEN 600 MG PO TABS: 600.0000 mg | ORAL_TABLET | Freq: Four times a day (QID) | ORAL | 0 refills | Status: DC

## 2016-09-08 NOTE — Discharge Summary (Signed)
OB Discharge Summary     Patient Name: Allison BeetsJasmine L Varady DOB: 07/20/1991 MRN: [id number]  Date of admission: 09/06/2016 Delivering MD: Jacklyn ShellRESENZO-DISHMON, FRANCES   Date of discharge: 09/08/2016  Admitting diagnosis: 40 wks term labor Intrauterine pregnancy: 536w2d     Secondary diagnosis:  Active Problems:   Uterine contractions during pregnancy   Encounter for trial of labor   Postpartum care following vaginal delivery  Additional problems: none     Discharge diagnosis: Term Pregnancy Delivered                                                                                                Post partum procedures:none  Augmentation: AROM  Complications: None  Hospital course:  Onset of Labor With Vaginal Delivery     25 y.o. yo G3P3003 at 846w2d was admitted in Active Labor on 09/06/2016. Patient had an uncomplicated labor course as follows:  Membrane Rupture Time/Date: 10:37 AM ,09/06/2016   Intrapartum Procedures: Episiotomy: None [1]                                         Lacerations:  None [1]  Patient had a delivery of a Viable infant. 09/06/2016  Information for the patient's newborn:  Rudean Hittvans, Girl Elyshia [[id number]][[id number]]  Delivery Method: Vag-Spont    Pateint had an uncomplicated postpartum course.  She is ambulating, tolerating a regular diet, passing flatus, and urinating well. Patient is discharged home in stable condition on 09/08/16.    Physical exam Vitals:   09/07/16 0500 09/07/16 1857 09/08/16 0532 09/08/16 0925  BP: 105/65 (!) 117/59 (!) 126/19 110/65  Pulse: 82 64 62   Resp: 18 18 18    Temp: 97.9 F (36.6 C) 97.6 F (36.4 C) 98.7 F (37.1 C)   TempSrc: Oral Oral Oral   SpO2: 100%     Weight:      Height:       General: alert, cooperative and no distress Lochia: appropriate Uterine Fundus: firm Incision: Healing well with no significant drainage DVT Evaluation: No evidence of DVT seen on physical exam. Labs: Lab Results  Component Value Date   WBC 6.9 09/06/2016   HGB 9.9 (L) 09/06/2016   HCT 29.2 (L) 09/06/2016   MCV 83.0 09/06/2016   PLT 303 09/06/2016   CMP Latest Ref Rng & Units 09/06/2016  Glucose 65 - 99 mg/dL 75  BUN 6 - 20 mg/dL 9  Creatinine 7.820.44 - 9.561.00 mg/dL 2.130.57  Sodium 086135 - 578145 mmol/L 134(L)  Potassium 3.5 - 5.1 mmol/L 4.0  Chloride 101 - 111 mmol/L 107  CO2 22 - 32 mmol/L 20(L)  Calcium 8.9 - 10.3 mg/dL 8.9  Total Protein 6.5 - 8.1 g/dL 6.5  Total Bilirubin 0.3 - 1.2 mg/dL 0.4  Alkaline Phos 38 - 126 U/L 198(H)  AST 15 - 41 U/L 18  ALT 14 - 54 U/L 11(L)    Discharge instruction: per After Visit Summary and "Baby and Me Booklet".  After visit meds:  Medication List    TAKE these medications   calcium carbonate 500 MG chewable tablet Commonly known as:  TUMS - dosed in mg elemental calcium Chew 2 tablets by mouth 2 (two) times daily as needed for indigestion or heartburn.   terconazole 0.4 % vaginal cream Commonly known as:  TERAZOL 7 Place 1 applicator vaginally at bedtime.   VITAFOL GUMMIES 3.33-0.333-34.8 MG Chew Chew 3 tablets by mouth daily.       Diet: routine diet  Activity: Advance as tolerated. Pelvic rest for 6 weeks.   Outpatient follow up:6 weeks Follow up Appt:Future Appointments Date Time Provider Department Center  09/10/2016 11:30 AM Roe Coombsachelle A Denney, CNM [facility] None  09/11/2016 7:30 AM [facility] None   Follow up Visit:No Follow-up on file.  Postpartum contraception: Undecided  Newborn Data: Live born female  Birth Weight: 6 lb 15.6 oz (3165 g) APGAR: 8, 9  Baby Feeding: Bottle Disposition:home with mother   09/08/2016 Wynelle BourgeoisWILLIAMS,Arna Luis, CNM

## 2016-09-08 NOTE — Discharge Instructions (Signed)
Home Care Instructions for Mom Introduction  ACTIVITY  Gradually return to your regular activities.  Let yourself rest. Nap while your baby sleeps.  Avoid lifting anything that is heavier than 10 lb (4.5 kg) until your health care provider says it is okay.  Avoid activities that take a lot of effort and energy (are strenuous) until approved by your health care provider. Walking at a slow-to-moderate pace is usually safe.  If you had a cesarean delivery:  Do not vacuum, climb stairs, or drive a car for 4-6 weeks.  Have someone help you at home until you feel like you can do your usual activities yourself.  Do exercises as told by your health care provider, if this applies. VAGINAL BLEEDING You may continue to bleed for 4-6 weeks after delivery. Over time, the amount of blood usually decreases and the color of the blood usually gets lighter. However, the flow of bright red blood may increase if you have been too active. If you need to use more than one pad in an hour because your pad gets soaked, or if you pass a large clot:  Lie down.  Raise your feet.  Place a cold compress on your lower abdomen.  Rest.  Call your health care provider. If you are breastfeeding, your period should return anytime between 8 weeks after delivery and the time that you stop breastfeeding. If you are not breastfeeding, your period should return 6-8 weeks after delivery. PERINEAL CARE The perineal area, or perineum, is the part of your body between your thighs. After delivery, this area needs special care. Follow these instructions as told by your health care provider.  Take warm tub baths for 15-20 minutes.  Use medicated pads and pain-relieving sprays and creams as told.  Do not use tampons or douches until vaginal bleeding has stopped.  Each time you go to the bathroom:  Use a peri bottle.  Change your pad.  Use towelettes in place of toilet paper until your stitches have healed.  Do Kegel  exercises every day. Kegel exercises help to maintain the muscles that support the vagina, bladder, and bowels. You can do these exercises while you are standing, sitting, or lying down. To do Kegel exercises:  Tighten the muscles of your abdomen and the muscles that surround your birth canal.  Hold for a few seconds.  Relax.  Repeat until you have done this 5 times in a row.  To prevent hemorrhoids from developing or getting worse:  Drink enough fluid to keep your urine clear or pale yellow.  Avoid straining when having a bowel movement.  Take over-the-counter medicines and stool softeners as told by your health care provider. BREAST CARE  Wear a tight-fitting bra.  Avoid taking over-the-counter pain medicine for breast discomfort.  Apply ice to the breasts to help with discomfort as needed:  Put ice in a plastic bag.  Place a towel between your skin and the bag.  Leave the ice on for 20 minutes or as told by your health care provider. NUTRITION  Eat a well-balanced diet.  Do not try to lose weight quickly by cutting back on calories.  Take your prenatal vitamins until your postpartum checkup or until your health care provider tells you to stop. POSTPARTUM DEPRESSION You may find yourself crying for no apparent reason and unable to cope with all of the changes that come with having a newborn. This mood is called postpartum depression. Postpartum depression happens because your hormone levels change after  delivery. If you have postpartum depression, get support from your partner, friends, and family. If the depression does not go away on its own after several weeks, contact your health care provider. BREAST SELF-EXAM Do a breast self-exam each month, at the same time of the month. If you are breastfeeding, check your breasts just after a feeding, when your breasts are less full. If you are breastfeeding and your period has started, check your breasts on day 5, 6, or 7 of your  period. Report any lumps, bumps, or discharge to your health care provider. Know that breasts are normally lumpy if you are breastfeeding. This is temporary, and it is not a health risk. INTIMACY AND SEXUALITY Avoid sexual activity for at least 3-4 weeks after delivery or until the brownish-red vaginal flow is completely gone. If you want to avoid pregnancy, use some form of birth control. You can get pregnant after delivery, even if you have not had your period. SEEK MEDICAL CARE IF:  You feel unable to cope with the changes that a child brings to your life, and these feelings do not go away after several weeks.  You notice a lump, a bump, or discharge on your breast. SEEK IMMEDIATE MEDICAL CARE IF:  Blood soaks your pad in 1 hour or less.  You have:  Severe pain or cramping in your lower abdomen.  A bad-smelling vaginal discharge.  A fever that is not controlled by medicine.  A fever, and an area of your breast is red and sore.  Pain or redness in your calf.  Sudden, severe chest pain.  Shortness of breath.  Painful or bloody urination.  Problems with your vision.  You vomit for 12 hours or longer.  You develop a severe headache.  You have serious thoughts about hurting yourself, your child, or anyone else. This information is not intended to replace advice given to you by your health care provider. Make sure you discuss any questions you have with your health care provider. Document Released: 09/28/2000 Document Revised: 03/08/2016 Document Reviewed: 04/04/2015  2017 Elsevier   Iron-Rich Diet  Introduction Iron is a mineral that helps your body to produce hemoglobin. Hemoglobin is a protein in your red blood cells that carries oxygen to your body's tissues. Eating too little iron may cause you to feel weak and tired, and it can increase your risk for infection. Eating enough iron is necessary for your body's metabolism, muscle function, and nervous system. Iron is  naturally found in many foods. It can also be added to foods or fortified in foods. There are two types of dietary iron:  Heme iron. Heme iron is absorbed by the body more easily than nonheme iron. Heme iron is found in meat, poultry, and fish.  Nonheme iron. Nonheme iron is found in dietary supplements, iron-fortified grains, beans, and vegetables. You may need to follow an iron-rich diet if:  You have been diagnosed with iron deficiency or iron-deficiency anemia.  You have a condition that prevents you from absorbing dietary iron, such as:  Infection in your intestines.  Celiac disease. This involves long-lasting (chronic) inflammation of your intestines.  You do not eat enough iron.  You eat a diet that is high in foods that impair iron absorption.  You have lost a lot of blood.  You have heavy bleeding during your menstrual cycle.  You are pregnant. What is my plan? Your health care provider may help you to determine how much iron you need per day based  on your condition. Generally, when a person consumes sufficient amounts of iron in the diet, the following iron needs are met:  Men.  16-64 years old: 11 mg per day.  55-16 years old: 8 mg per day.  Women.  60-68 years old: 15 mg per day.  31-26 years old: 18 mg per day.  Over 68 years old: 8 mg per day.  Pregnant women: 27 mg per day.  Breastfeeding women: 9 mg per day. What do I need to know about an iron-rich diet?  Eat fresh fruits and vegetables that are high in vitamin C along with foods that are high in iron. This will help increase the amount of iron that your body absorbs from food, especially with foods containing nonheme iron. Foods that are high in vitamin C include oranges, peppers, tomatoes, and mango.  Take iron supplements only as directed by your health care provider. Overdose of iron can be life-threatening. If you were prescribed iron supplements, take them with orange juice or a vitamin C  supplement.  Cook foods in pots and pans that are made from iron.  Eat nonheme iron-containing foods alongside foods that are high in heme iron. This helps to improve your iron absorption.  Certain foods and drinks contain compounds that impair iron absorption. Avoid eating these foods in the same meal as iron-rich foods or with iron supplements. These include:  Coffee, black tea, and red wine.  Milk, dairy products, and foods that are high in calcium.  Beans, soybeans, and peas.  Whole grains.  When eating foods that contain both nonheme iron and compounds that impair iron absorption, follow these tips to absorb iron better.  Soak beans overnight before cooking.  Soak whole grains overnight and drain them before using.  Ferment flours before baking, such as using yeast in bread dough. What foods can I eat? Grains  Iron-fortified breakfast cereal. Iron-fortified whole-wheat bread. Enriched rice. Sprouted grains. Vegetables  Spinach. Potatoes with skin. Green peas. Broccoli. Red and green bell peppers. Fermented vegetables. Fruits  Prunes. Raisins. Oranges. Strawberries. Mango. Grapefruit. Meats and Other Protein Sources  Beef liver. Oysters. Beef. Shrimp. [place]. Chicken. Green Tree. Sardines. Chickpeas. Nuts. Tofu. Beverages  Tomato juice. Fresh orange juice. Prune juice. Hibiscus tea. Fortified instant breakfast shakes. Condiments  Tahini. Fermented soy sauce. Sweets and Desserts  Black-strap molasses. Other  Wheat germ. The items listed above may not be a complete list of recommended foods or beverages. Contact your dietitian for more options.  What foods are not recommended? Grains  Whole grains. Bran cereal. Bran flour. Oats. Vegetables  Artichokes. Brussels sprouts. Kale. Fruits  Blueberries. Raspberries. Strawberries. Figs. Meats and Other Protein Sources  Soybeans. Products made from soy protein. Dairy  Milk. Cream. Cheese. Yogurt. Cottage cheese. Beverages    Coffee. Black tea. Red wine. Sweets and Desserts  Cocoa. Chocolate. Ice cream. Other  Basil. Oregano. Parsley. The items listed above may not be a complete list of foods and beverages to avoid. Contact your dietitian for more information.  This information is not intended to replace advice given to you by your health care provider. Make sure you discuss any questions you have with your health care provider. Document Released: 05/15/2005 Document Revised: 04/20/2016 Document Reviewed: 04/28/2014  2017 Elsevier

## 2016-09-10 ENCOUNTER — Encounter: Payer: Medicaid Other | Admitting: Certified Nurse Midwife

## 2016-09-11 ENCOUNTER — Inpatient Hospital Stay (HOSPITAL_COMMUNITY): Admission: RE | Admit: 2016-09-11 | Payer: Medicaid Other | Source: Ambulatory Visit

## 2016-09-20 ENCOUNTER — Ambulatory Visit: Payer: Medicaid Other | Admitting: Certified Nurse Midwife

## 2016-10-18 ENCOUNTER — Ambulatory Visit: Payer: Medicaid Other | Admitting: Certified Nurse Midwife

## 2017-02-01 ENCOUNTER — Encounter (HOSPITAL_COMMUNITY): Payer: Self-pay

## 2017-02-01 ENCOUNTER — Inpatient Hospital Stay (HOSPITAL_COMMUNITY)
Admission: AD | Admit: 2017-02-01 | Discharge: 2017-02-01 | Disposition: A | Discharge status: Home / Self Care | Payer: Medicaid Other | Source: Ambulatory Visit | Attending: Obstetrics and Gynecology | Admitting: Obstetrics and Gynecology

## 2017-02-01 ENCOUNTER — Inpatient Hospital Stay (HOSPITAL_COMMUNITY): Payer: Medicaid Other

## 2017-02-01 DIAGNOSIS — O200 Threatened abortion: Secondary | ICD-10-CM | POA: Diagnosis not present

## 2017-02-01 DIAGNOSIS — O26891 Other specified pregnancy related conditions, first trimester: Secondary | ICD-10-CM | POA: Insufficient documentation

## 2017-02-01 DIAGNOSIS — Z3A01 Less than 8 weeks gestation of pregnancy: Secondary | ICD-10-CM | POA: Insufficient documentation

## 2017-02-01 DIAGNOSIS — O99331 Smoking (tobacco) complicating pregnancy, first trimester: Secondary | ICD-10-CM | POA: Insufficient documentation

## 2017-02-01 DIAGNOSIS — O208 Other hemorrhage in early pregnancy: Secondary | ICD-10-CM | POA: Diagnosis not present

## 2017-02-01 DIAGNOSIS — O209 Hemorrhage in early pregnancy, unspecified: Secondary | ICD-10-CM

## 2017-02-01 DIAGNOSIS — R102 Pelvic and perineal pain: Secondary | ICD-10-CM | POA: Diagnosis present

## 2017-02-01 LAB — URINALYSIS, ROUTINE W REFLEX MICROSCOPIC
Bilirubin Urine: NEGATIVE
Glucose, UA: NEGATIVE mg/dL
Hgb urine dipstick: NEGATIVE
KETONES UR: 5 mg/dL — AB
LEUKOCYTES UA: NEGATIVE
NITRITE: NEGATIVE
PROTEIN: NEGATIVE mg/dL
Specific Gravity, Urine: 1.029 (ref 1.005–1.030)
pH: 5 (ref 5.0–8.0)

## 2017-02-01 LAB — WET PREP, GENITAL
CLUE CELLS WET PREP: NONE SEEN
Sperm: NONE SEEN
TRICH WET PREP: NONE SEEN
YEAST WET PREP: NONE SEEN

## 2017-02-01 LAB — CBC
HCT: 29.3 % — ABNORMAL LOW (ref 36.0–46.0)
HEMOGLOBIN: 10 g/dL — AB (ref 12.0–15.0)
MCH: 29.6 pg (ref 26.0–34.0)
MCHC: 34.1 g/dL (ref 30.0–36.0)
MCV: 86.7 fL (ref 78.0–100.0)
PLATELETS: 295 10*3/uL (ref 150–400)
RBC: 3.38 MIL/uL — AB (ref 3.87–5.11)
RDW: 13.4 % (ref 11.5–15.5)
WBC: 5.1 10*3/uL (ref 4.0–10.5)

## 2017-02-01 LAB — POCT PREGNANCY, URINE: PREG TEST UR: POSITIVE — AB

## 2017-02-01 LAB — HCG, QUANTITATIVE, PREGNANCY: HCG, BETA CHAIN, QUANT, S: 11885 m[IU]/mL — AB (ref <5)

## 2017-02-01 NOTE — Discharge Instructions (Signed)

## 2017-02-01 NOTE — MAU Provider Note (Signed)
History     CSN: [id number]  Arrival date and time: 02/01/17 4098   First Provider Initiated Contact with Patient 02/01/17 0202      Chief Complaint  Patient presents with  . Vaginal Bleeding   Vaginal Bleeding  The patient's primary symptoms include pelvic pain and vaginal bleeding. This is a new problem. The current episode started yesterday (around 2200 ). The problem occurs constantly. The problem has been gradually worsening. Pain severity now: 3/10  The problem affects both sides. She is pregnant. Associated symptoms include vomiting ("morning sickness"). Pertinent negatives include no chills, dysuria, fever, frequency, nausea or urgency. The vaginal bleeding is heavier than menses. She has been passing clots. She has not been passing tissue. Nothing aggravates the symptoms. She has tried nothing for the symptoms. She uses nothing for contraception. Her menstrual history has been regular (LMP 11/17/16 ).   Past Medical History:  Diagnosis Date  . Anemia   . Chlamydia    x 2    Past Surgical History:  Procedure Laterality Date  . NO PAST SURGERIES      Family History  Problem Relation Age of Onset  . Thyroid disease Mother   . Cancer Father     colon    Social History  Substance Use Topics  . Smoking status: Current Every Day Smoker    Packs/day: 0.50    Types: Cigarettes  . Smokeless tobacco: Never Used     Comment: not during pregnancy  . Alcohol use Yes     Comment: occasional    Allergies: No Known Allergies  Prescriptions Prior to Admission  Medication Sig Dispense Refill Last Dose  . calcium carbonate (TUMS - DOSED IN MG ELEMENTAL CALCIUM) 500 MG chewable tablet Chew 2 tablets by mouth 2 (two) times daily as needed for indigestion or heartburn.   More than a month at Unknown time  . ibuprofen (ADVIL,MOTRIN) 600 MG tablet Take 1 tablet (600 mg total) by mouth every 6 (six) hours. 30 tablet 0 More than a month at Unknown time  . Prenatal Vit-Fe  Phos-FA-Omega (VITAFOL GUMMIES) 3.33-0.333-34.8 MG CHEW Chew 3 tablets by mouth daily. 90 tablet 12 More than a month at Unknown time  . terconazole (TERAZOL 7) 0.4 % vaginal cream Place 1 applicator vaginally at bedtime. 45 g 0 More than a month at Unknown time    Review of Systems  Constitutional: Negative for chills and fever.  Gastrointestinal: Positive for vomiting ("morning sickness"). Negative for nausea.  Genitourinary: Positive for pelvic pain and vaginal bleeding. Negative for dysuria, frequency and urgency.   Physical Exam   Blood pressure 112/62, pulse 94, temperature 97.6 F (36.4 C), temperature source Oral, resp. rate 18, last menstrual period 11/17/2016, unknown if currently breastfeeding.  Physical Exam  Nursing note and vitals reviewed. Constitutional: She is oriented to person, place, and time. She appears well-developed and well-nourished. No distress.  HENT:  Head: Normocephalic.  Cardiovascular: Normal rate.   Respiratory: Effort normal.  GI: Soft. There is no tenderness. There is no rebound.  Genitourinary:  Genitourinary Comments:  External: no lesion Vagina: large amount of blood and clots  Cervix: pink, smooth, visually open with POC at the os. Tissue removed from OS easily. Will send to path.  Uterus: NSSC Adnexa: NT   Neurological: She is alert and oriented to person, place, and time.  Skin: Skin is warm and dry.  Psychiatric: She has a normal mood and affect.   Results for orders placed or performed  during the hospital encounter of 02/01/17 (from the past 24 hour(s))  Pregnancy, urine POC     Status: Abnormal   Collection Time: 02/01/17  1:58 AM  Result Value Ref Range   Preg Test, Ur POSITIVE (A) NEGATIVE  Urinalysis, Routine w reflex microscopic     Status: Abnormal   Collection Time: 02/01/17  2:00 AM  Result Value Ref Range   Color, Urine YELLOW YELLOW   APPearance CLEAR CLEAR   Specific Gravity, Urine 1.029 1.005 - 1.030   pH 5.0 5.0 -  8.0   Glucose, UA NEGATIVE NEGATIVE mg/dL   Hgb urine dipstick NEGATIVE NEGATIVE   Bilirubin Urine NEGATIVE NEGATIVE   Ketones, ur 5 (A) NEGATIVE mg/dL   Protein, ur NEGATIVE NEGATIVE mg/dL   Nitrite NEGATIVE NEGATIVE   Leukocytes, UA NEGATIVE NEGATIVE  Wet prep, genital     Status: Abnormal   Collection Time: 02/01/17  2:10 AM  Result Value Ref Range   Yeast Wet Prep HPF POC NONE SEEN NONE SEEN   Trich, Wet Prep NONE SEEN NONE SEEN   Clue Cells Wet Prep HPF POC NONE SEEN NONE SEEN   WBC, Wet Prep HPF POC FEW (A) NONE SEEN   Sperm NONE SEEN    US Ob Comp Less 14 Wks  Result Date: 02/01/2017 CLINICAL DATA:  Initial evaluation for acute vaginal bleeding, cramping. Currently pregnant. Beta HCG is pending. GA by LMP = 10 weeks and 6 days. EXAM: OBSTETRIC <14 WK [place] AND TRANSVAGINAL OB US TECHNIQUE: Both transabdominal and transvaginal ultrasound examinations were performed for complete evaluation of the gestation as well as the maternal uterus, adnexal regions, and pelvic cul-de-sac. Transvaginal technique was performed to assess early pregnancy. COMPARISON:  None available. FINDINGS: Intrauterine gestational sac: Irregular anechoic structure which may reflect a gestational sac. Yolk sac:  Not visualized Embryo:  Not visualized Cardiac Activity: N/A the Heart Rate: N/A  bpm MSD: 5.1  mm   5 w   2  d Subchorionic hemorrhage: Heterogeneous endometrial contents with surrounding moderate to large subchorionic hemorrhage. Maternal uterus/adnexae: Ovary is well seen within the add next sella and are normal in appearance. No adnexal mass. Trace free fluid noted. IMPRESSION: 1. Heterogeneous endometrial contents with possible irregular gestational sac, with no discrete yolk sac or embryo identified. Findings are suspicious but not yet definitive for failed pregnancy. Correlation with serum beta HCG recommended. Recommend follow-up US in 10-14 days for definitive diagnosis. This recommendation follows SRU  consensus guidelines: Diagnostic Criteria for Nonviable Pregnancy Early in the First Trimester. Malva Limes Med 2013; 409:8119-14. 2. Moderate to large subchorionic hemorrhage. 3. Normal sonographic appearance of the ovaries and adnexa. No adnexal mass. Electronically Signed   By: Rise Mu M.D.   On: 02/01/2017 03:36   US Ob Transvaginal  Result Date: 02/01/2017 CLINICAL DATA:  Initial evaluation for acute vaginal bleeding, cramping. Currently pregnant. Beta HCG is pending. GA by LMP = 10 weeks and 6 days. EXAM: OBSTETRIC <14 WK [place] AND TRANSVAGINAL OB US TECHNIQUE: Both transabdominal and transvaginal ultrasound examinations were performed for complete evaluation of the gestation as well as the maternal uterus, adnexal regions, and pelvic cul-de-sac. Transvaginal technique was performed to assess early pregnancy. COMPARISON:  None available. FINDINGS: Intrauterine gestational sac: Irregular anechoic structure which may reflect a gestational sac. Yolk sac:  Not visualized Embryo:  Not visualized Cardiac Activity: N/A the Heart Rate: N/A  bpm MSD: 5.1  mm   5 w   2  d Subchorionic  hemorrhage: Heterogeneous endometrial contents with surrounding moderate to large subchorionic hemorrhage. Maternal uterus/adnexae: Ovary is well seen within the add next sella and are normal in appearance. No adnexal mass. Trace free fluid noted. IMPRESSION: 1. Heterogeneous endometrial contents with possible irregular gestational sac, with no discrete yolk sac or embryo identified. Findings are suspicious but not yet definitive for failed pregnancy. Correlation with serum beta HCG recommended. Recommend follow-up US in 10-14 days for definitive diagnosis. This recommendation follows SRU consensus guidelines: Diagnostic Criteria for Nonviable Pregnancy Early in the First Trimester. Malva Limes Med 2013; 629:5284-13. 2. Moderate to large subchorionic hemorrhage. 3. Normal sonographic appearance of the ovaries and adnexa. No adnexal  mass. Electronically Signed   By: Rise Mu M.D.   On: 02/01/2017 03:36     MAU Course  Procedures  MDM   Assessment and Plan   1. Threatened abortion in first trimester   2. Vaginal bleeding in pregnancy, first trimester    DC home Comfort measures reviewed  1st Trimester precautions  Bleeding precautions Ectopic precautions RX: none  Return to MAU as needed FU with OB as planned  Follow-up Information    [facility] [place] [facility]ERN[facility] ADMISSIONS Follow up.   Contact information: [address] [id number] mc [place] [address] [phone]           Tawnya Crook 02/01/2017, 2:03 AM

## 2017-02-01 NOTE — MAU Note (Signed)
Patient presents via EMS with c/o vaginal bleeding that started around 2200 yesterday. Patient is also having abdominal cramping. Patient had a positive home pregnancy test on march 10th.

## 2017-02-02 LAB — RPR: RPR Ser Ql: NONREACTIVE

## 2017-02-02 LAB — HIV ANTIBODY (ROUTINE TESTING W REFLEX): HIV SCREEN 4TH GENERATION: NONREACTIVE

## 2017-02-04 LAB — GC/CHLAMYDIA PROBE AMP (~~LOC~~) NOT AT ARMC
CHLAMYDIA, DNA PROBE: NEGATIVE
Neisseria Gonorrhea: NEGATIVE

## 2017-05-28 ENCOUNTER — Ambulatory Visit: Payer: Medicaid Other | Admitting: Obstetrics & Gynecology

## 2017-06-12 ENCOUNTER — Ambulatory Visit: Payer: Medicaid Other | Admitting: Obstetrics & Gynecology

## 2017-06-27 ENCOUNTER — Ambulatory Visit: Payer: Medicaid Other | Admitting: Obstetrics

## 2017-07-09 ENCOUNTER — Ambulatory Visit: Payer: Self-pay | Admitting: Obstetrics

## 2017-11-16 ENCOUNTER — Encounter (HOSPITAL_COMMUNITY): Payer: Self-pay

## 2017-12-24 ENCOUNTER — Ambulatory Visit: Payer: Medicaid Other | Admitting: Obstetrics

## 2018-07-08 ENCOUNTER — Encounter: Payer: Self-pay | Admitting: General Practice

## 2018-07-08 ENCOUNTER — Other Ambulatory Visit: Payer: Self-pay

## 2018-07-08 ENCOUNTER — Ambulatory Visit (INDEPENDENT_AMBULATORY_CARE_PROVIDER_SITE_OTHER): Payer: Medicaid Other | Admitting: *Deleted

## 2018-07-08 VITALS — BP 100/62 | HR 92 | Temp 98.2°F | Ht 60.0 in | Wt 150.4 lb

## 2018-07-08 DIAGNOSIS — Z3480 Encounter for supervision of other normal pregnancy, unspecified trimester: Secondary | ICD-10-CM | POA: Insufficient documentation

## 2018-07-08 DIAGNOSIS — Z3201 Encounter for pregnancy test, result positive: Secondary | ICD-10-CM

## 2018-07-08 LAB — POCT URINE PREGNANCY: Preg Test, Ur: POSITIVE — AB

## 2018-07-08 MED ORDER — PRENATAL PLUS 27-1 MG PO TABS: 1.0000 | ORAL_TABLET | Freq: Every day | ORAL | 12 refills | Status: DC

## 2018-07-08 NOTE — Progress Notes (Signed)
   [name] presents today for UPT. She has no unusual complaints. LMP:04/02/18    OBJECTIVE: Appears well, in no apparent distress.  OB History    Gravida  5   Para  3   Term  3   Preterm  0   AB  1   Living  3     SAB  1   TAB  0   Ectopic  0   Multiple  0   Live Births  3          Home UPT Result: Positive In-Office UPT result:Positive I have reviewed the patient's medical, obstetrical, social, and family histories, and medications.   ASSESSMENT: Positive pregnancy test. EDD: 03./25/2020, GA:[id number]; G5P3013.  PLAN Prenatal care to be completed at: [facility]. PNV sent to patient's pharmacy. Daphine Deutscher' Brixton Schnapp, Bronson Ingamika L, RN

## 2018-07-29 ENCOUNTER — Encounter: Payer: Self-pay | Admitting: General Practice

## 2018-08-05 ENCOUNTER — Encounter: Payer: Self-pay | Admitting: Obstetrics and Gynecology

## 2018-08-07 ENCOUNTER — Encounter: Payer: Medicaid Other | Admitting: Obstetrics and Gynecology

## 2018-08-08 ENCOUNTER — Encounter: Payer: Self-pay | Admitting: General Practice

## 2018-08-26 ENCOUNTER — Encounter: Payer: Self-pay | Admitting: General Practice

## 2018-09-04 ENCOUNTER — Ambulatory Visit (INDEPENDENT_AMBULATORY_CARE_PROVIDER_SITE_OTHER): Payer: Medicaid Other | Admitting: Family Medicine

## 2018-09-04 ENCOUNTER — Encounter: Payer: Self-pay | Admitting: Family Medicine

## 2018-09-04 DIAGNOSIS — Z3482 Encounter for supervision of other normal pregnancy, second trimester: Secondary | ICD-10-CM

## 2018-09-04 DIAGNOSIS — Z23 Encounter for immunization: Secondary | ICD-10-CM

## 2018-09-04 DIAGNOSIS — Z3480 Encounter for supervision of other normal pregnancy, unspecified trimester: Secondary | ICD-10-CM

## 2018-09-04 NOTE — Progress Notes (Signed)
Subjective:   Allison Long is a 27 y.o. [phone] at [redacted]w[redacted]d by LMP being seen today for her first obstetrical visit.  Her obstetrical history is significant for group B strep colonizer and three term vaginal deliveries. Patient does intend to breast feed. Pregnancy history fully reviewed.  Patient reports no complaints.  HISTORY: OB History  Gravida Para Term Preterm AB Living  5 3 3  0 1 3  SAB TAB Ectopic Multiple Live Births  1 0 0 0 3    # Outcome Date GA Lbr Len/2nd Weight Sex Delivery Anes PTL Lv  5 Current           4 SAB 2018          3 Term 09/06/16 [redacted]w[redacted]d 10:01 / 00:12 6 lb 15.6 oz (3.165 kg) F Vag-Spont EPI  LIV     Name: Streety,GIRL Jerry     Apgar1: 8  Apgar5: 9  2 Term 08/19/14 [redacted]w[redacted]d 05:50 / 00:17 6 lb 4.7 oz (2.855 kg) F Vag-Spont EPI  LIV     Apgar1: 9  Apgar5: 9  1 Term 09/28/11 [redacted]w[redacted]d 14:57 / 01:13 6 lb 7 oz (2.92 kg) M Vag-Spont EPI  LIV     Birth Comments: extra digit beside fifth finger on left hand.  birthmark under right nipple.     Name: Ostrosky,BOY Kenyatta     Apgar1: 2  Apgar5: 7   Last pap smear was 03/2016 and was normal Past Medical History:  Diagnosis Date  . Anemia   . Chlamydia    x 2   Past Surgical History:  Procedure Laterality Date  . NO PAST SURGERIES     Family History  Problem Relation Age of Onset  . Thyroid disease Mother   . Cancer Father        colon   Social History   Tobacco Use  . Smoking status: Former Smoker    Packs/day: 0.50    Types: Cigarettes  . Smokeless tobacco: Never Used  . Tobacco comment: not during pregnancy  Substance Use Topics  . Alcohol use: Not Currently    Comment: occasional  . Drug use: Not Currently    Frequency: 7.0 times per week    Types: Marijuana    Comment: none during pregnancy   No Known Allergies Current Outpatient Medications on File Prior to Visit  Medication Sig Dispense Refill  . Prenatal Vit-Fe Phos-FA-Omega (VITAFOL GUMMIES) 3.33-0.333-34.8 MG CHEW Chew 3 tablets by mouth  daily. 90 tablet 12  . calcium carbonate (TUMS - DOSED IN MG ELEMENTAL CALCIUM) 500 MG chewable tablet Chew 2 tablets by mouth 2 (two) times daily as needed for indigestion or heartburn.     No current facility-administered medications on file prior to visit.      Exam   Vitals:   09/04/18 1023  BP: 97/62  Pulse: 92  Weight: 157 lb 14.4 oz (71.6 kg)   Fetal Heart Rate (bpm): 142  Uterus:     Pelvic Exam: Perineum: no hemorrhoids, normal perineum   Vulva: normal external genitalia, no lesions   Vagina:  normal mucosa, normal discharge   Cervix: no lesions and normal, pap smear done.    Adnexa: normal adnexa and no mass, fullness, tenderness   Bony Pelvis: average  System: General: well-developed, well-nourished female in no acute distress   Breast:  normal appearance, no masses or tenderness   Skin: normal coloration and turgor, no rashes   Neurologic: oriented, normal, negative, normal mood  Extremities: normal strength, tone, and muscle mass, ROM of all joints is normal   HEENT PERRLA, extraocular movement intact and sclera clear, anicteric   Mouth/Teeth mucous membranes moist, pharynx normal without lesions and dental hygiene good   Neck supple and no masses   Cardiovascular: regular rate and rhythm   Respiratory:  no respiratory distress, normal breath sounds   Abdomen: soft, non-tender; bowel sounds normal; no masses,  no organomegaly     Assessment:   Pregnancy: [id number] Patient Active Problem List   Diagnosis Date Noted  . Supervision of other normal pregnancy, antepartum 07/08/2018     Plan:  1. Supervision of other normal pregnancy, antepartum Initial labs drawn. Continue prenatal vitamins. Genetic Screening discussed, declined Ultrasound discussed; fetal anatomic survey: requested. Problem list reviewed and updated. The nature of [facility] with multiple MDs and other Advanced Practice Providers was explained to  patient; also emphasized that residents, students are part of our team. Routine obstetric precautions reviewed.  Return in about 5 weeks (around 10/09/2018) for ROB.  Cristal DeerLaurel S. Earlene PlaterWallace, DO OB/GYN Fellow

## 2018-09-04 NOTE — Progress Notes (Signed)
Pt presents for NOB without complaints today.

## 2018-09-04 NOTE — Patient Instructions (Signed)
-   If you do not receive a call to schedule your ultrasound, please call clinic to get it scheduled  - Sign up for MyChart, and we will notify you of your lab results

## 2018-09-05 LAB — OBSTETRIC PANEL, INCLUDING HIV
ANTIBODY SCREEN: NEGATIVE
BASOS ABS: 0 10*3/uL (ref 0.0–0.2)
BASOS: 1 %
EOS (ABSOLUTE): 0.1 10*3/uL (ref 0.0–0.4)
Eos: 1 %
HEMATOCRIT: 33.6 % — AB (ref 34.0–46.6)
HIV SCREEN 4TH GENERATION: NONREACTIVE
Hemoglobin: 11.5 g/dL (ref 11.1–15.9)
Hepatitis B Surface Ag: NEGATIVE
IMMATURE GRANS (ABS): 0.1 10*3/uL (ref 0.0–0.1)
Immature Granulocytes: 1 %
LYMPHS: 14 %
Lymphocytes Absolute: 1.1 10*3/uL (ref 0.7–3.1)
MCH: 30.8 pg (ref 26.6–33.0)
MCHC: 34.2 g/dL (ref 31.5–35.7)
MCV: 90 fL (ref 79–97)
MONOCYTES: 8 %
MONOS ABS: 0.6 10*3/uL (ref 0.1–0.9)
NEUTROS PCT: 75 %
Neutrophils Absolute: 5.6 10*3/uL (ref 1.4–7.0)
PLATELETS: 291 10*3/uL (ref 150–450)
RBC: 3.73 x10E6/uL — AB (ref 3.77–5.28)
RDW: 11.9 % — AB (ref 12.3–15.4)
RPR Ser Ql: NONREACTIVE
Rh Factor: POSITIVE
Rubella Antibodies, IGG: 16.5 index (ref >0.99)
WBC: 7.4 10*3/uL (ref 3.4–10.8)

## 2018-09-08 LAB — CULTURE, OB URINE

## 2018-09-08 LAB — URINE CULTURE, OB REFLEX

## 2018-09-10 ENCOUNTER — Encounter (HOSPITAL_COMMUNITY): Payer: Self-pay

## 2018-09-18 ENCOUNTER — Ambulatory Visit (HOSPITAL_COMMUNITY)
Admission: RE | Admit: 2018-09-18 | Discharge: 2018-09-18 | Disposition: A | Discharge status: Home / Self Care | Payer: Medicaid Other | Source: Ambulatory Visit | Attending: Family Medicine | Admitting: Family Medicine

## 2018-09-18 ENCOUNTER — Encounter (HOSPITAL_COMMUNITY): Payer: Self-pay

## 2018-09-18 DIAGNOSIS — Z363 Encounter for antenatal screening for malformations: Secondary | ICD-10-CM | POA: Diagnosis not present

## 2018-09-18 DIAGNOSIS — Z3480 Encounter for supervision of other normal pregnancy, unspecified trimester: Secondary | ICD-10-CM

## 2018-09-18 DIAGNOSIS — Z3A24 24 weeks gestation of pregnancy: Secondary | ICD-10-CM | POA: Diagnosis not present

## 2018-10-03 ENCOUNTER — Ambulatory Visit (HOSPITAL_COMMUNITY)
Admission: EM | Admit: 2018-10-03 | Discharge: 2018-10-03 | Disposition: A | Discharge status: Home / Self Care | Payer: Medicaid Other | Attending: Internal Medicine | Admitting: Internal Medicine

## 2018-10-03 ENCOUNTER — Encounter (HOSPITAL_COMMUNITY): Payer: Self-pay | Admitting: Emergency Medicine

## 2018-10-03 DIAGNOSIS — Z79899 Other long term (current) drug therapy: Secondary | ICD-10-CM | POA: Insufficient documentation

## 2018-10-03 DIAGNOSIS — O26893 Other specified pregnancy related conditions, third trimester: Secondary | ICD-10-CM | POA: Diagnosis not present

## 2018-10-03 DIAGNOSIS — Z3A26 26 weeks gestation of pregnancy: Secondary | ICD-10-CM | POA: Insufficient documentation

## 2018-10-03 DIAGNOSIS — Z87891 Personal history of nicotine dependence: Secondary | ICD-10-CM | POA: Diagnosis not present

## 2018-10-03 DIAGNOSIS — N898 Other specified noninflammatory disorders of vagina: Secondary | ICD-10-CM | POA: Insufficient documentation

## 2018-10-03 LAB — POCT URINALYSIS DIP (DEVICE)
Bilirubin Urine: NEGATIVE
GLUCOSE, UA: NEGATIVE mg/dL
HGB URINE DIPSTICK: NEGATIVE
KETONES UR: NEGATIVE mg/dL
Nitrite: NEGATIVE
PH: 6.5 (ref 5.0–8.0)
PROTEIN: NEGATIVE mg/dL
SPECIFIC GRAVITY, URINE: 1.025 (ref 1.005–1.030)
UROBILINOGEN UA: 0.2 mg/dL (ref 0.0–1.0)

## 2018-10-03 MED ORDER — CLOTRIMAZOLE 2 % VA CREA: 1.0000 | TOPICAL_CREAM | Freq: Every day | VAGINAL | 0 refills | Status: DC

## 2018-10-03 NOTE — ED Triage Notes (Signed)
[facility] x 3 days.  States hx of yeast infections with every pregnancy.  Pt due date end of March.  Last prenatal visit in November, due for OB visit 12/26.  Denies abdominal pain.

## 2018-10-03 NOTE — ED Provider Notes (Signed)
[facility]    CSN: [id number] Arrival date & time: 10/03/18  1504     History   Chief Complaint Chief Complaint  Patient presents with  . Vaginal Discharge  . Possible Pregnancy    HPI Allison Long is a 27 y.o. female.   27 year old female who is [redacted] weeks pregnant comes in for 3-day history of vaginal irritation.  States no obvious discharge, but gets the symptoms every pregnancy with positive yeast.  She denies vaginal bleeding.  Denies abdominal pain.  Denies fever, chills, night sweats.  Next OB appointment in 6 days.  Continues to feel normal fetal movement.     Past Medical History:  Diagnosis Date  . Anemia   . Chlamydia    x 2    Patient Active Problem List   Diagnosis Date Noted  . Supervision of other normal pregnancy, antepartum 07/08/2018    Past Surgical History:  Procedure Laterality Date  . NO PAST SURGERIES      OB History    Gravida  5   Para  3   Term  3   Preterm  0   AB  1   Living  3     SAB  1   TAB  0   Ectopic  0   Multiple  0   Live Births  3            Home Medications    Prior to Admission medications   Medication Sig Start Date End Date Taking? Authorizing Provider  calcium carbonate (TUMS - DOSED IN MG ELEMENTAL CALCIUM) 500 MG chewable tablet Chew 2 tablets by mouth 2 (two) times daily as needed for indigestion or heartburn.    [provider]  clotrimazole (GYNE-LOTRIMIN 3) 2 % vaginal cream Place 1 Applicatorful vaginally at bedtime. 10/03/18   Cathie HoopsYu, Amour Cutrone V, PA-C  Prenatal Vit-Fe Phos-FA-Omega (VITAFOL GUMMIES) 3.33-0.333-34.8 MG CHEW Chew 3 tablets by mouth daily. 04/03/16   Roe Coombsenney, Rachelle A, CNM    Family History Family History  Problem Relation Age of Onset  . Thyroid disease Mother   . Cancer Father        colon    Social History Social History   Tobacco Use  . Smoking status: Former Smoker    Packs/day: 0.50    Types: Cigarettes  . Smokeless tobacco: Never Used    . Tobacco comment: not during pregnancy  Substance Use Topics  . Alcohol use: Not Currently    Comment: occasional  . Drug use: Not Currently    Frequency: 7.0 times per week    Types: Marijuana    Comment: none during pregnancy     Allergies   Patient has no known allergies.   Review of Systems Review of Systems  Reason unable to perform ROS: See HPI as above.     Physical Exam Triage Vital Signs ED Triage Vitals  Enc Vitals Group     BP 10/03/18 1524 119/66     Pulse Rate 10/03/18 1524 84     Resp 10/03/18 1524 16     Temp 10/03/18 1524 97.8 F (36.6 C)     Temp Source 10/03/18 1524 Oral     SpO2 10/03/18 1524 100 %     Weight --      Height --      Head Circumference --      Peak Flow --      Pain Score 10/03/18 1525 7  Pain Loc --      Pain Edu? --      Excl. in GC? --    No data found.  Updated Vital Signs BP 119/66 (BP Location: Left Arm)   Pulse 84   Temp 97.8 F (36.6 C) (Oral)   Resp 16   LMP 04/02/2018 (Approximate)   SpO2 100%   Physical Exam Constitutional:      General: She is not in acute distress.    Appearance: She is well-developed.  HENT:     Head: Normocephalic and atraumatic.  Eyes:     Conjunctiva/sclera: Conjunctivae normal.     Pupils: Pupils are equal, round, and reactive to light.  Cardiovascular:     Rate and Rhythm: Normal rate and regular rhythm.     Heart sounds: Normal heart sounds. No murmur. No friction rub. No gallop.   Pulmonary:     Effort: Pulmonary effort is normal.     Breath sounds: Normal breath sounds. No wheezing or rales.  Abdominal:     General: Bowel sounds are normal.     Palpations: There is no mass.     Tenderness: There is no abdominal tenderness. There is no guarding or rebound.  Skin:    General: Skin is warm and dry.  Neurological:     Mental Status: She is alert and oriented to person, place, and time.  Psychiatric:        Behavior: Behavior normal.        Judgment: Judgment normal.       UC Treatments / Results  Labs (all labs ordered are listed, but only abnormal results are displayed) Labs Reviewed  POCT URINALYSIS DIP (DEVICE) - Abnormal; Notable for the following components:      Result Value   Leukocytes, UA SMALL (*)    All other components within normal limits  URINE CULTURE    EKG None  Radiology No results found.  Procedures Procedures (including critical care time)  Medications Ordered in UC Medications - No data to display  Initial Impression / Assessment and Plan / UC Course  I have reviewed the triage vital signs and the nursing notes.  Pertinent labs & imaging results that were available during my care of the patient were reviewed by me and considered in my medical decision making (see chart for details).    Urine with small leuks.  Will send for urine culture for further evaluation.  Will treat for yeast with topical clotrimazole.  Patient declined cytology testing.  Will have patient follow-up with OB/GYN as scheduled for further evaluation and management needed.  Return precautions given.  Patient expresses understanding and agrees to plan.  Final Clinical Impressions(s) / UC Diagnoses   Final diagnoses:  Vaginal irritation    ED Prescriptions    Medication Sig Dispense Auth. Provider   clotrimazole (GYNE-LOTRIMIN 3) 2 % vaginal cream Place 1 Applicatorful vaginally at bedtime. 21 g Threasa AlphaYu, Rainier Feuerborn V, PA-C        Donae Kueker V, [place] JerseyPA-C 10/03/18 1620

## 2018-10-03 NOTE — Discharge Instructions (Signed)
Will treat for yeast infection with vaginal gel. Monitor for any new exposures. Follow up with OB as scheduled for reevaluation needed. Monitor for any abdominal pain, vaginal bleeding, [facility] needed.

## 2018-10-04 LAB — URINE CULTURE: Culture: NO GROWTH

## 2018-10-09 ENCOUNTER — Encounter: Payer: Medicaid Other | Admitting: Obstetrics and Gynecology

## 2018-10-09 ENCOUNTER — Other Ambulatory Visit: Payer: Medicaid Other

## 2018-10-15 NOTE — L&D Delivery Note (Addendum)
OB[facility]N Faculty [facility] Delivery Note  Allison Long is a 28 y.o. G8T1572 s/p SVD at [redacted]w[redacted]d. She was admitted for SOL.   ROM: 5h 80m with clear fluid GBS Status: negative Maximum Maternal Temperature: 100.1  Labor Progress: . Augmentation with AROM  Delivery Date/Time: 1932 01/11/2019 Delivery: Called to room and patient was complete and pushing. Head delivered ROA. No nuchal cord present. Shoulder and body delivered in usual fashion. Infant with spontaneous cry, placed on mother's abdomen, dried and stimulated. Cord clamped x 2 after 1-minute delay, and cut by patient's sister. Cord blood drawn. Placenta delivered spontaneously with gentle cord traction. Fundus firm with massage and Pitocin. Labia, perineum, vagina, and cervix inspected inspected with no lacerations noted.   Placenta: delivered spontaneously, intact Complications: none Lacerations: none EBL: 58ml  Infant: vigorous female  APGARs 9 & 9  weight pending  Burman Nieves, MD Family Medicine Resident   Attestation: I was present for the entire delivery.   Cristal Deer. Earlene Plater, DO OB/GYN Fellow

## 2018-10-16 ENCOUNTER — Telehealth: Payer: Self-pay | Admitting: Obstetrics and Gynecology

## 2018-10-23 ENCOUNTER — Encounter: Payer: Medicaid Other | Admitting: Obstetrics & Gynecology

## 2018-10-23 ENCOUNTER — Other Ambulatory Visit: Payer: Medicaid Other

## 2018-10-30 ENCOUNTER — Encounter: Payer: Self-pay | Admitting: Obstetrics & Gynecology

## 2018-10-30 NOTE — Telephone Encounter (Signed)
Left Voice message to CB and reschedule missed appointments, plus mailed letter.

## 2018-12-19 ENCOUNTER — Encounter: Payer: Medicaid Other | Admitting: Obstetrics & Gynecology

## 2018-12-19 ENCOUNTER — Other Ambulatory Visit: Payer: Medicaid Other

## 2018-12-23 ENCOUNTER — Telehealth: Payer: Self-pay | Admitting: *Deleted

## 2018-12-23 NOTE — Telephone Encounter (Signed)
I called patient to reschedule the appointment, I spoke with the grandmother of patient's children and she stated she would have patient return call.

## 2018-12-30 ENCOUNTER — Other Ambulatory Visit: Payer: Self-pay

## 2018-12-30 ENCOUNTER — Other Ambulatory Visit (HOSPITAL_COMMUNITY)
Admission: RE | Admit: 2018-12-30 | Discharge: 2018-12-30 | Disposition: A | Discharge status: Home / Self Care | Payer: Medicaid Other | Source: Ambulatory Visit | Attending: Obstetrics & Gynecology | Admitting: Obstetrics & Gynecology

## 2018-12-30 ENCOUNTER — Encounter: Payer: Self-pay | Admitting: Obstetrics and Gynecology

## 2018-12-30 ENCOUNTER — Ambulatory Visit (INDEPENDENT_AMBULATORY_CARE_PROVIDER_SITE_OTHER): Payer: Medicaid Other | Admitting: Obstetrics and Gynecology

## 2018-12-30 DIAGNOSIS — Z3A38 38 weeks gestation of pregnancy: Secondary | ICD-10-CM | POA: Diagnosis not present

## 2018-12-30 DIAGNOSIS — Z3480 Encounter for supervision of other normal pregnancy, unspecified trimester: Secondary | ICD-10-CM

## 2018-12-30 DIAGNOSIS — Z3483 Encounter for supervision of other normal pregnancy, third trimester: Secondary | ICD-10-CM

## 2018-12-30 DIAGNOSIS — Z23 Encounter for immunization: Secondary | ICD-10-CM | POA: Diagnosis not present

## 2018-12-30 NOTE — Patient Instructions (Signed)

## 2018-12-30 NOTE — Progress Notes (Signed)
ROB, has not been here since 08/2018.  BTL signed. TDAP given in LD, tolerated well.

## 2018-12-30 NOTE — Progress Notes (Signed)
Subjective:  Allison Long is a 28 y.o. [phone] at [redacted]w[redacted]d being seen today for ongoing prenatal care.  She is currently monitored for the following issues for this low-risk pregnancy and has Supervision of other normal pregnancy, antepartum on their problem list.  Patient reports general discomforts of pregnancy.  Contractions: Irritability. Vag. Bleeding: None.  Movement: Present. Denies leaking of fluid.   The following portions of the patient's history were reviewed and updated as appropriate: allergies, current medications, past family history, past medical history, past social history, past surgical history and problem list. Problem list updated.  Objective:   Vitals:   12/30/18 0932  BP: 123/74  Pulse: (!) 108  Weight: 172 lb 11.2 oz (78.3 kg)    Fetal Status: Fetal Heart Rate (bpm): 131   Movement: Present     General:  Alert, oriented and cooperative. Patient is in no acute distress.  Skin: Skin is warm and dry. No rash noted.   Cardiovascular: Normal heart rate noted  Respiratory: Normal respiratory effort, no problems with respiration noted  Abdomen: Soft, gravid, appropriate for gestational age. Pain/Pressure: Present     Pelvic:  Cervical exam performed        Extremities: Normal range of motion.  Edema: Trace  Mental Status: Normal mood and affect. Normal behavior. Normal judgment and thought content.   Urinalysis:      Assessment and Plan:  Pregnancy: [id number] at [redacted]w[redacted]d  1. Supervision of other normal pregnancy, antepartum Stable Pt not seen since 11/19 d/t childcare Vaginal cultures and GBS today Labor precautions - Tdap vaccine greater than or equal to 7yo IM - Cervicovaginal ancillary only( [place]) - Strep Gp B NAA  2. Unwanted fertility BTL papers signed today Pt made aware of 30 day wait period  Term labor symptoms and general obstetric precautions including but not limited to vaginal bleeding, contractions, leaking of fluid and fetal movement were  reviewed in detail with the patient. Please refer to After Visit Summary for other counseling recommendations.  Return in about 1 week (around 01/06/2019) for OB visit.   Hermina Staggers, MD

## 2018-12-30 NOTE — Addendum Note (Signed)
Addended by: Hermina Staggers on: 12/30/2018 01:30 PM   Modules accepted: Orders, SmartSet

## 2018-12-31 LAB — CERVICOVAGINAL ANCILLARY ONLY
Chlamydia: NEGATIVE
Neisseria Gonorrhea: NEGATIVE

## 2019-01-01 LAB — STREP GP B NAA: Strep Gp B NAA: NEGATIVE

## 2019-01-02 ENCOUNTER — Telehealth (HOSPITAL_COMMUNITY): Payer: Self-pay | Admitting: *Deleted

## 2019-01-02 NOTE — Telephone Encounter (Signed)
Preadmission screen  

## 2019-01-06 ENCOUNTER — Ambulatory Visit (INDEPENDENT_AMBULATORY_CARE_PROVIDER_SITE_OTHER): Payer: Medicaid Other | Admitting: Obstetrics and Gynecology

## 2019-01-06 ENCOUNTER — Other Ambulatory Visit: Payer: Self-pay

## 2019-01-06 ENCOUNTER — Encounter: Payer: Self-pay | Admitting: Obstetrics and Gynecology

## 2019-01-06 VITALS — BP 129/75 | HR 107 | Wt 174.7 lb

## 2019-01-06 DIAGNOSIS — Z3A39 39 weeks gestation of pregnancy: Secondary | ICD-10-CM

## 2019-01-06 DIAGNOSIS — Z3480 Encounter for supervision of other normal pregnancy, unspecified trimester: Secondary | ICD-10-CM

## 2019-01-06 DIAGNOSIS — Z3483 Encounter for supervision of other normal pregnancy, third trimester: Secondary | ICD-10-CM

## 2019-01-06 NOTE — Progress Notes (Signed)
   PRENATAL VISIT NOTE  Subjective:  Allison Long is a 28 y.o. [phone] at [redacted]w[redacted]d being seen today for ongoing prenatal care.  She is currently monitored for the following issues for this low-risk pregnancy and has Supervision of other normal pregnancy, antepartum on their problem list.  Patient reports no complaints.  Contractions: Irritability. Vag. Bleeding: None.  Movement: Present. Denies leaking of fluid.   The following portions of the patient's history were reviewed and updated as appropriate: allergies, current medications, past family history, past medical history, past social history, past surgical history and problem list.   Objective:   Vitals:   01/06/19 1043  BP: 129/75  Pulse: (!) 107  Weight: 174 lb 11.2 oz (79.2 kg)    Fetal Status: Fetal Heart Rate (bpm): 132   Movement: Present     General:  Alert, oriented and cooperative. Patient is in no acute distress.  Skin: Skin is warm and dry. No rash noted.   Cardiovascular: Normal heart rate noted  Respiratory: Normal respiratory effort, no problems with respiration noted  Abdomen: Soft, gravid, appropriate for gestational age.  Pain/Pressure: Present     Pelvic: Cervical exam deferred        Extremities: Normal range of motion.  Edema: Trace  Mental Status: Normal mood and affect. Normal behavior. Normal judgment and thought content.   Assessment and Plan:  Pregnancy: [id number] at [redacted]w[redacted]d 1. Supervision of other normal pregnancy, antepartum Patient is doing well without complaints Scheduled for IOL on 01/14/19  Term labor symptoms and general obstetric precautions including but not limited to vaginal bleeding, contractions, leaking of fluid and fetal movement were reviewed in detail with the patient. Please refer to After Visit Summary for other counseling recommendations.   Return in about 6 weeks (around 02/17/2019) for postpartum.  Future Appointments  Date Time Provider Department Center  01/14/2019  7:00 AM [facility]  SCHED ROOM [facility] None    Catalina Antigua, MD

## 2019-01-06 NOTE — Progress Notes (Signed)
Pt is here for ROB. [redacted]w[redacted]d.

## 2019-01-08 ENCOUNTER — Other Ambulatory Visit (HOSPITAL_COMMUNITY): Payer: Self-pay | Admitting: Advanced Practice Midwife

## 2019-01-11 ENCOUNTER — Inpatient Hospital Stay (HOSPITAL_COMMUNITY): Payer: Medicaid Other | Admitting: Anesthesiology

## 2019-01-11 ENCOUNTER — Inpatient Hospital Stay (HOSPITAL_COMMUNITY)
Admission: AD | Admit: 2019-01-11 | Discharge: 2019-01-13 | DRG: 807 | Disposition: A | Discharge status: Home / Self Care | Payer: Medicaid Other | Attending: Obstetrics and Gynecology | Admitting: Obstetrics and Gynecology

## 2019-01-11 ENCOUNTER — Encounter (HOSPITAL_COMMUNITY): Payer: Self-pay | Admitting: *Deleted

## 2019-01-11 ENCOUNTER — Other Ambulatory Visit: Payer: Self-pay

## 2019-01-11 DIAGNOSIS — Z87891 Personal history of nicotine dependence: Secondary | ICD-10-CM | POA: Diagnosis not present

## 2019-01-11 DIAGNOSIS — O480 Post-term pregnancy: Principal | ICD-10-CM | POA: Diagnosis present

## 2019-01-11 DIAGNOSIS — Z3A00 Weeks of gestation of pregnancy not specified: Secondary | ICD-10-CM | POA: Diagnosis not present

## 2019-01-11 DIAGNOSIS — Z3A40 40 weeks gestation of pregnancy: Secondary | ICD-10-CM

## 2019-01-11 LAB — CBC
HCT: 32.4 % — ABNORMAL LOW (ref 36.0–46.0)
Hemoglobin: 10 g/dL — ABNORMAL LOW (ref 12.0–15.0)
MCH: 26 pg (ref 26.0–34.0)
MCHC: 30.9 g/dL (ref 30.0–36.0)
MCV: 84.2 fL (ref 80.0–100.0)
Platelets: 300 10*3/uL (ref 150–400)
RBC: 3.85 MIL/uL — ABNORMAL LOW (ref 3.87–5.11)
RDW: 12.8 % (ref 11.5–15.5)
WBC: 6.5 10*3/uL (ref 4.0–10.5)
nRBC: 0 % (ref 0.0–0.2)

## 2019-01-11 LAB — TYPE AND SCREEN
ABO/RH(D): O POS
Antibody Screen: NEGATIVE

## 2019-01-11 LAB — RPR: RPR Ser Ql: NONREACTIVE

## 2019-01-11 LAB — ABO/RH: ABO/RH(D): O POS

## 2019-01-11 MED ORDER — LACTATED RINGERS IV SOLN: 500.0000 mL | INTRAVENOUS | Status: DC | PRN

## 2019-01-11 MED ORDER — WITCH HAZEL-GLYCERIN EX PADS: 1.0000 "application " | MEDICATED_PAD | CUTANEOUS | Status: DC | PRN

## 2019-01-11 MED ORDER — TETANUS-DIPHTH-ACELL PERTUSSIS 5-2.5-18.5 LF-MCG/0.5 IM SUSP: 0.5000 mL | Freq: Once | INTRAMUSCULAR | Status: DC

## 2019-01-11 MED ORDER — TERBUTALINE SULFATE 1 MG/ML IJ SOLN
INTRAMUSCULAR | Status: AC
  Filled 2019-01-11: qty 1

## 2019-01-11 MED ORDER — ZOLPIDEM TARTRATE 5 MG PO TABS: 5.0000 mg | ORAL_TABLET | Freq: Every evening | ORAL | Status: DC | PRN

## 2019-01-11 MED ORDER — ONDANSETRON HCL 4 MG/2ML IJ SOLN: 4.0000 mg | INTRAMUSCULAR | Status: DC | PRN

## 2019-01-11 MED ORDER — LIDOCAINE HCL (PF) 1 % IJ SOLN: 30.0000 mL | INTRAMUSCULAR | Status: DC | PRN

## 2019-01-11 MED ORDER — DIBUCAINE 1 % RE OINT: 1.0000 "application " | TOPICAL_OINTMENT | RECTAL | Status: DC | PRN

## 2019-01-11 MED ORDER — MEASLES, MUMPS & RUBELLA VAC IJ SOLR: 0.5000 mL | Freq: Once | INTRAMUSCULAR | Status: DC

## 2019-01-11 MED ORDER — PHENYLEPHRINE 40 MCG/ML (10ML) SYRINGE FOR IV PUSH (FOR BLOOD PRESSURE SUPPORT): 80.0000 ug | PREFILLED_SYRINGE | INTRAVENOUS | Status: DC | PRN

## 2019-01-11 MED ORDER — ACETAMINOPHEN 325 MG PO TABS
650.0000 mg | ORAL_TABLET | ORAL | Status: DC | PRN
  Administered 2019-01-12: 650 mg via ORAL
  Filled 2019-01-11: qty 2

## 2019-01-11 MED ORDER — TERBUTALINE SULFATE 1 MG/ML IJ SOLN
0.2500 mg | Freq: Once | INTRAMUSCULAR | Status: AC
  Administered 2019-01-11: 0.25 mg via SUBCUTANEOUS

## 2019-01-11 MED ORDER — OXYCODONE-ACETAMINOPHEN 5-325 MG PO TABS: 1.0000 | ORAL_TABLET | ORAL | Status: DC | PRN

## 2019-01-11 MED ORDER — SODIUM CHLORIDE (PF) 0.9 % IJ SOLN
INTRAMUSCULAR | Status: DC | PRN
  Administered 2019-01-11: 12 mL/h via EPIDURAL

## 2019-01-11 MED ORDER — OXYCODONE-ACETAMINOPHEN 5-325 MG PO TABS: 2.0000 | ORAL_TABLET | ORAL | Status: DC | PRN

## 2019-01-11 MED ORDER — ACETAMINOPHEN 325 MG PO TABS: 650.0000 mg | ORAL_TABLET | ORAL | Status: DC | PRN

## 2019-01-11 MED ORDER — LACTATED RINGERS IV SOLN
INTRAVENOUS | Status: DC
  Administered 2019-01-11 (×3): via INTRAVENOUS

## 2019-01-11 MED ORDER — PHENYLEPHRINE 40 MCG/ML (10ML) SYRINGE FOR IV PUSH (FOR BLOOD PRESSURE SUPPORT)
80.0000 ug | PREFILLED_SYRINGE | INTRAVENOUS | Status: DC | PRN
  Filled 2019-01-11: qty 10

## 2019-01-11 MED ORDER — OXYTOCIN BOLUS FROM INFUSION: 500.0000 mL | Freq: Once | INTRAVENOUS | Status: DC

## 2019-01-11 MED ORDER — COCONUT OIL OIL: 1.0000 "application " | TOPICAL_OIL | Status: DC | PRN

## 2019-01-11 MED ORDER — SENNOSIDES-DOCUSATE SODIUM 8.6-50 MG PO TABS
2.0000 | ORAL_TABLET | ORAL | Status: DC
  Administered 2019-01-11 – 2019-01-12 (×2): 2 via ORAL
  Filled 2019-01-11 (×2): qty 2

## 2019-01-11 MED ORDER — SOD CITRATE-CITRIC ACID 500-334 MG/5ML PO SOLN
30.0000 mL | ORAL | Status: DC | PRN
  Administered 2019-01-11: 30 mL via ORAL
  Filled 2019-01-11 (×2): qty 15

## 2019-01-11 MED ORDER — OXYTOCIN 40 UNITS IN NORMAL SALINE INFUSION - SIMPLE MED
INTRAVENOUS | Status: AC
  Filled 2019-01-11: qty 1000

## 2019-01-11 MED ORDER — IBUPROFEN 600 MG PO TABS
600.0000 mg | ORAL_TABLET | Freq: Four times a day (QID) | ORAL | Status: DC
  Administered 2019-01-11 – 2019-01-13 (×7): 600 mg via ORAL
  Filled 2019-01-11 (×7): qty 1

## 2019-01-11 MED ORDER — EPHEDRINE 5 MG/ML INJ: 10.0000 mg | INTRAVENOUS | Status: DC | PRN

## 2019-01-11 MED ORDER — LIDOCAINE HCL (PF) 1 % IJ SOLN
INTRAMUSCULAR | Status: DC | PRN
  Administered 2019-01-11: 5 mL via EPIDURAL

## 2019-01-11 MED ORDER — ONDANSETRON HCL 4 MG/2ML IJ SOLN
4.0000 mg | Freq: Four times a day (QID) | INTRAMUSCULAR | Status: DC | PRN
  Administered 2019-01-11: 4 mg via INTRAVENOUS
  Filled 2019-01-11: qty 2

## 2019-01-11 MED ORDER — LACTATED RINGERS IV SOLN
500.0000 mL | Freq: Once | INTRAVENOUS | Status: AC
  Administered 2019-01-11: 500 mL via INTRAVENOUS

## 2019-01-11 MED ORDER — DIPHENHYDRAMINE HCL 25 MG PO CAPS: 25.0000 mg | ORAL_CAPSULE | Freq: Four times a day (QID) | ORAL | Status: DC | PRN

## 2019-01-11 MED ORDER — OXYTOCIN 40 UNITS IN NORMAL SALINE INFUSION - SIMPLE MED
2.5000 [IU]/h | INTRAVENOUS | Status: DC
  Administered 2019-01-12: 2.5 [IU]/h via INTRAVENOUS

## 2019-01-11 MED ORDER — PRENATAL MULTIVITAMIN CH
1.0000 | ORAL_TABLET | Freq: Every day | ORAL | Status: DC
  Administered 2019-01-12 – 2019-01-13 (×2): 1 via ORAL
  Filled 2019-01-11 (×2): qty 1

## 2019-01-11 MED ORDER — SIMETHICONE 80 MG PO CHEW: 80.0000 mg | CHEWABLE_TABLET | ORAL | Status: DC | PRN

## 2019-01-11 MED ORDER — BENZOCAINE-MENTHOL 20-0.5 % EX AERO: 1.0000 "application " | INHALATION_SPRAY | CUTANEOUS | Status: DC | PRN

## 2019-01-11 MED ORDER — DIPHENHYDRAMINE HCL 50 MG/ML IJ SOLN: 12.5000 mg | INTRAMUSCULAR | Status: DC | PRN

## 2019-01-11 MED ORDER — FENTANYL-BUPIVACAINE-NACL 0.5-0.125-0.9 MG/250ML-% EP SOLN
12.0000 mL/h | EPIDURAL | Status: DC | PRN
  Filled 2019-01-11: qty 250

## 2019-01-11 MED ORDER — ONDANSETRON HCL 4 MG PO TABS: 4.0000 mg | ORAL_TABLET | ORAL | Status: DC | PRN

## 2019-01-11 NOTE — Progress Notes (Signed)
OB/[facility]N Faculty Practice: Labor Progress Note  Subjective: Called into room because of fetal deceleration with AROM. On arrival, patient receiving bolus and oxygen in place with patient lying on side. Comfortable with epidural.   Objective: BP 118/77   Pulse 78   Temp 97.6 F (36.4 C) (Oral)   Resp 20   Ht 5' (1.524 m)   Wt 79.4 kg   LMP 04/02/2018 (Approximate)   BMI 34.18 kg/m  Gen: nervous appearing but NAD Dilation: 6 Effacement (%): 80 Cervical Position: Anterior Station: -2 Presentation: Vertex Exam by:: Dr. Earlene Plater  Assessment and Plan: 28 y.o. [id number] [redacted]w[redacted]d here with spontaneous onset of labor.   Labor: Admitted early this morning with SOL. Initially called 7cm but actually 6cm at this time. AROM for clear fluid. FSE placed without difficulty in setting of fetal deceleration.  -- pain control: epidural  -- PPH Risk: low  Fetal Well-Being: EFW 6-7lbs by Leopolds. Cephalic by sutures.  -- Category II - continuous fetal monitoring - FSE now in place, was Cat I strip on leaving the room - terbutaline x 1, fluids, O2 and position changes  -- GBS negative    Allison Long S. Earlene Plater, DO OB/GYN Fellow, Faculty Practice  2:27 PM

## 2019-01-11 NOTE — Anesthesia Procedure Notes (Addendum)
Epidural Patient location during procedure: OB Start time: 01/11/2019 8:07 AM End time: 01/11/2019 8:21 AM  Staffing Anesthesiologist: Trevor Iha, MD Performed: anesthesiologist   Preanesthetic Checklist Completed: patient identified, site marked, surgical consent, pre-op evaluation, timeout performed, IV checked, risks and benefits discussed and monitors and equipment checked  Epidural Patient position: sitting Prep: site prepped and draped and DuraPrep Patient monitoring: continuous pulse ox and blood pressure Approach: midline Injection technique: LOR air  Needle:  Needle type: Tuohy  Needle gauge: 17 G Needle length: 9 cm and 9 Needle insertion depth: 6 cm Catheter type: closed end flexible Catheter size: 19 Gauge Catheter at skin depth: 11 cm Test dose: negative  Assessment Events: blood not aspirated, injection not painful, no injection resistance, negative IV test and no paresthesia  Additional Notes Patient identified. Risks/Benefits/Options discussed with patient including but not limited to bleeding, infection, nerve damage, paralysis, failed block, incomplete pain control, headache, blood pressure changes, nausea, vomiting, reactions to medication both or allergic, itching and postpartum back pain. Confirmed with bedside nurse the patient's most recent platelet count. Confirmed with patient that they are not currently taking any anticoagulation, have any bleeding history or any family history of bleeding disorders. Patient expressed understanding and wished to proceed. All questions were answered. Sterile technique was used throughout the entire procedure. Please see nursing notes for vital signs. Test dose was given through epidural needle and negative prior to continuing to dose epidural or start infusion. Warning signs of high block given to the patient including shortness of breath, tingling/numbness in hands, complete motor block, or any concerning symptoms with  instructions to call for help. Patient was given instructions on fall risk and not to get out of bed. All questions and concerns addressed with instructions to call with any issues. 1 Attempt (S) . Patient tolerated procedure well.

## 2019-01-11 NOTE — Progress Notes (Addendum)
OB/[facility]N Faculty [facility]: Labor Progress Note  Subjective: Comfortable with epidural, not feeling contractions or pressure  Objective: BP (!) 110/56   Pulse (!) 103   Temp 99.8 F (37.7 C) (Oral)   Resp 18   Ht 5' (1.524 m)   Wt 79.4 kg   LMP 04/02/2018 (Approximate)   BMI 34.18 kg/m  Gen: well appearing, no distress Dilation: 8 Dilation Complete Date: 01/11/19 Dilation Complete Time: 1731 Effacement (%): 80 Cervical Position: Anterior Station: Plus 1 Presentation: Vertex Exam by:: Bhambri  Assessment and Plan: 28 y.o. [id number] [redacted]w[redacted]d here for SOL.   Labor: Initially called to bedside for 9.5cm with anterior lip while having a prolonged variable. Recovered to Cat 1 strip with repositioning, patient found to be 8cm with swollen anterior lip, continue to manage expectantly -- pain control: epidural in place -- PPH Risk: low  Fetal Well-Being: EFW 6-7lbs by Leopolds. Cephalic by exam.  -- Category 2 - continuous fetal monitoring  -- GBS negative   Burman Nieves, MD Family Medicine Resident 6:10 PM

## 2019-01-11 NOTE — Discharge Summary (Addendum)
Obstetrics Discharge Summary OB/GYN Faculty Practice   Patient Name: Allison Long DOB: [birth year] MRN: [id number]  Date of admission: 01/11/2019 Delivering MD: Burman Nieves A   Date of discharge: 01/15/2019  Admitting diagnosis: 40wks, contractions Intrauterine pregnancy: [redacted]w[redacted]d     Secondary diagnosis:   Active Problems:   Post-dates pregnancy    Discharge diagnosis: Term Pregnancy Delivered                                            Postpartum procedures: None  Complications: none    Hospital course: Allison Long is a 28 y.o. [redacted]w[redacted]d who was admitted for spontaneous onset of labor. Her pregnancy was uncomplicated. Her labor course was notable for augmentation with AROM. Delivery was uncomplicated. Please see delivery/op note for additional details. Her postpartum course was uncomplicated. She was breastfeeding without difficulty. By day of discharge, she was passing flatus, urinating, eating and drinking without difficulty. Her pain was well-controlled, and she was discharged home with baby. She will follow-up in clinic in 4-6 weeks.   Physical exam  Vitals:   01/11/19 1801 01/11/19 1830 01/11/19 1833 01/11/19 1900  BP: (!) 110/56 (!) 104/49 121/64 111/64  Pulse: (!) 103 91 95 99  Resp: 18 18 18 18   Temp:    100 F (37.8 C)  TempSrc:    Axillary  SpO2:      Weight:      Height:       General: alert Lochia: appropriate Uterine Fundus: firm Incision: Healing well with no significant drainage DVT Evaluation: No evidence of DVT seen on physical exam. Labs: Lab Results  Component Value Date   WBC 6.5 01/11/2019   HGB 10.0 (L) 01/11/2019   HCT 32.4 (L) 01/11/2019   MCV 84.2 01/11/2019   PLT 300 01/11/2019   CMP Latest Ref Rng & Units 09/06/2016  Glucose 65 - 99 mg/dL 75  BUN 6 - 20 mg/dL 9  Creatinine 9.16 - 3.84 mg/dL 6.65  Sodium 993 - 570 mmol/L 134(L)  Potassium 3.5 - 5.1 mmol/L 4.0  Chloride 101 - 111 mmol/L 107  CO2 22 - 32 mmol/L 20(L)  Calcium 8.9 -  10.3 mg/dL 8.9  Total Protein 6.5 - 8.1 g/dL 6.5  Total Bilirubin 0.3 - 1.2 mg/dL 0.4  Alkaline Phos 38 - 126 U/L 198(H)  AST 15 - 41 U/L 18  ALT 14 - 54 U/L 11(L)    Discharge instructions: Per After Visit Summary and "Baby and Me Booklet"  After visit meds:  Allergies as of 01/13/2019   No Known Allergies     Medication List    TAKE these medications   calcium carbonate 500 MG chewable tablet Commonly known as:  TUMS - dosed in mg elemental calcium Chew 2 tablets by mouth 2 (two) times daily as needed for indigestion or heartburn.   clotrimazole 2 % vaginal cream Commonly known as:  GYNE-LOTRIMIN 3 Place 1 Applicatorful vaginally at bedtime.   ibuprofen 600 MG tablet Commonly known as:  ADVIL,MOTRIN Take 1 tablet (600 mg total) by mouth every 6 (six) hours.   Vitafol Gummies 3.33-0.333-34.8 MG Chew Chew 3 tablets by mouth daily.       Postpartum contraception: Depo Provera and Tubal Ligation Diet: Routine Diet Activity: Advance as tolerated. Pelvic rest for 6 weeks.   Follow-up Appt: Future Appointments  Date Time Provider Department Center  02/09/2019  9:15 AM [facility], Wilmer Floor, CNM [facility] None   Follow-up Visit:No follow-ups on file. Please schedule this patient for Postpartum visit in: 4 weeks with the following provider: Any provider Low risk pregnancy complicated by: none Delivery mode:  SVD Anticipated Birth Control:  other/unsure PP Procedures needed: none  Schedule Integrated BH visit: no   Newborn Data: Live born female  Birth Weight:   APGAR: ,   Newborn Delivery   Birth date/time:  01/11/2019 19:32:00 Delivery type:  Vaginal, Spontaneous     Baby Feeding: Breast Disposition:home with mother  Cristal Deer. Earlene Plater, DO OB/GYN Fellow, Faculty Practice

## 2019-01-11 NOTE — MAU Note (Addendum)
Contractions for couple hrs. Denies LOF or bleeding. Urinating a lot but not much comes out --no dysuria

## 2019-01-11 NOTE — Anesthesia Preprocedure Evaluation (Signed)
Anesthesia Evaluation  Patient identified by MRN, date of birth, ID band Patient awake    Reviewed: Allergy & Precautions, NPO status , Patient's Chart, lab work & pertinent test results  Airway Mallampati: II  TM Distance: >3 FB Neck ROM: Full    Dental no notable dental hx. (+) Teeth Intact   Pulmonary former smoker,    Pulmonary exam normal breath sounds clear to auscultation       Cardiovascular Exercise Tolerance: Good Normal cardiovascular exam Rhythm:Regular Rate:Normal     Neuro/Psych    GI/Hepatic Neg liver ROS,   Endo/Other    Renal/GU negative Renal ROS     Musculoskeletal   Abdominal   Peds  Hematology  (+) anemia , Hgb 10.0 Plts 300   Anesthesia Other Findings   Reproductive/Obstetrics (+) Pregnancy                            Anesthesia Physical Anesthesia Plan  ASA: II  Anesthesia Plan: Epidural   Post-op Pain Management:    Induction:   PONV Risk Score and Plan:   Airway Management Planned:   Additional Equipment:   Intra-op Plan:   Post-operative Plan:   Informed Consent: I have reviewed the patients History and Physical, chart, labs and discussed the procedure including the risks, benefits and alternatives for the proposed anesthesia with the patient or authorized representative who has indicated his/her understanding and acceptance.       Plan Discussed with:   Anesthesia Plan Comments: (40 .4 wk G5P3 for LEA hx of Anemia)        Anesthesia Quick Evaluation

## 2019-01-11 NOTE — H&P (Addendum)
OBSTETRIC ADMISSION HISTORY AND PHYSICAL  Allison Long is a 28 y.o. female [phone] with IUP at [redacted]w[redacted]d by LMP presenting for SOL, started feeling contractions several hours ago.   Reports fetal movement. Denies vaginal bleeding and LOF.   She received her prenatal care at [facility].  Support person in labor: FOB  Ultrasounds . Anatomy U/S: normal female  Prenatal History/Complications: . None  Past Medical History: Past Medical History:  Diagnosis Date  . Anemia   . Chlamydia    x 2   Past Surgical History: Past Surgical History:  Procedure Laterality Date  . NO PAST SURGERIES     Obstetrical History: OB History    Gravida  5   Para  3   Term  3   Preterm  0   AB  1   Living  3     SAB  1   TAB  0   Ectopic  0   Multiple  0   Live Births  3          Social History: Social History   Socioeconomic History  . Marital status: Single    Spouse name: Not on file  . Number of children: Not on file  . Years of education: Not on file  . Highest education level: Not on file  Occupational History  . Not on file  Social Needs  . Financial resource strain: Not on file  . Food insecurity:    Worry: Not on file    Inability: Not on file  . Transportation needs:    Medical: Not on file    Non-medical: Not on file  Tobacco Use  . Smoking status: Former Smoker    Packs/day: 0.50    Types: Cigarettes  . Smokeless tobacco: Never Used  . Tobacco comment: not during pregnancy  Substance and Sexual Activity  . Alcohol use: Not Currently    Comment: occasional  . Drug use: Not Currently    Frequency: 7.0 times per week    Types: Marijuana    Comment: none during pregnancy  . Sexual activity: Yes    Birth control/protection: None  Lifestyle  . Physical activity:    Days per week: Not on file    Minutes per session: Not on file  . Stress: Not on file  Relationships  . Social connections:    Talks on phone: Not on file    Gets together: Not on file     Attends religious service: Not on file    Active member of club or organization: Not on file    Attends meetings of clubs or organizations: Not on file    Relationship status: Not on file  Other Topics Concern  . Not on file  Social History Narrative  . Not on file   Family History: Family History  Problem Relation Age of Onset  . Thyroid disease Mother   . Cancer Father        colon   Allergies: No Known Allergies  Medications Prior to Admission  Medication Sig Dispense Refill Last Dose  . calcium carbonate (TUMS - DOSED IN MG ELEMENTAL CALCIUM) 500 MG chewable tablet Chew 2 tablets by mouth 2 (two) times daily as needed for indigestion or heartburn.   Past Week at Unknown time  . Prenatal Vit-Fe Phos-FA-Omega (VITAFOL GUMMIES) 3.33-0.333-34.8 MG CHEW Chew 3 tablets by mouth daily. 90 tablet 12 01/10/2019 at Unknown time  . clotrimazole (GYNE-LOTRIMIN 3) 2 % vaginal cream Place 1 Applicatorful  vaginally at bedtime. (Patient not taking: Reported on 12/30/2018) 21 g 0 Not Taking   Review of Systems  All systems reviewed and negative except as stated in HPI  Blood pressure 127/82, pulse 85, temperature (!) 97.5 F (36.4 C), resp. rate 18, height 5' (1.524 m), weight 79.4 kg, last menstrual period 04/02/2018. General appearance: mild distress Lungs: no respiratory distress Heart: regular rate  Abdomen: soft, non-tender; gravid abdomen Extremities: no LE edema  Presentation: cephalic Fetal monitoring: difficult to trace, Cat 2 Uterine activity: q4-39min Dilation: 7 Effacement (%): 80 Station: -2 Exam by:: Camelia Eng, RN  Prenatal labs: ABO, Rh: O/Positive/-- (11/21 1121) Antibody: Negative (11/21 1121) Rubella: 16.50 (11/21 1121) RPR: Non Reactive (11/21 1121)  HBsAg: Negative (11/21 1121)  HIV: Non Reactive (11/21 1121)  GBS: Negative (03/17 1014)  Glucola: normal Genetic screening:  none  Prenatal Transfer Tool  Maternal Diabetes: No Genetic Screening:  Declined Maternal Ultrasounds/Referrals: Normal Fetal Ultrasounds or other Referrals:  None Maternal Substance Abuse:  No Significant Maternal Medications:  None Significant Maternal Lab Results: Lab values include: Group B Strep negative  No results found for this or any previous visit (from the past 24 hour(s)).  Patient Active Problem List   Diagnosis Date Noted  . Post-dates pregnancy 01/11/2019  . Supervision of other normal pregnancy, antepartum 07/08/2018    Assessment/Plan:  Allison Long is a 28 y.o. G5P3013 at [redacted]w[redacted]d here for SOL.   Labor: expectant management  -- pain control: epidural placed  Fetal Wellbeing: EFW 7-8lbs by Leopold's. Cephalic by prior check.  -- GBS negative -- continuous fetal monitoring - Cat 2    Postpartum Planning -- breast/ interval BTL, likely depo before discharge -- Rh+ / given Tdap   Burman Nieves, MD Family Medicine Resident   Attestation: I have seen this patient and agree with the resident's documentation. I have examined them separately, and we have discussed the plan of care.  Cristal Deer. Earlene Plater, DO OB/GYN Fellow

## 2019-01-12 LAB — CBC
HEMATOCRIT: 24.9 % — AB (ref 36.0–46.0)
Hemoglobin: 7.9 g/dL — ABNORMAL LOW (ref 12.0–15.0)
MCH: 26.5 pg (ref 26.0–34.0)
MCHC: 31.7 g/dL (ref 30.0–36.0)
MCV: 83.6 fL (ref 80.0–100.0)
Platelets: 227 10*3/uL (ref 150–400)
RBC: 2.98 MIL/uL — ABNORMAL LOW (ref 3.87–5.11)
RDW: 13 % (ref 11.5–15.5)
WBC: 10.8 10*3/uL — ABNORMAL HIGH (ref 4.0–10.5)
nRBC: 0 % (ref 0.0–0.2)

## 2019-01-12 MED ORDER — OXYTOCIN 40 UNITS IN NORMAL SALINE INFUSION - SIMPLE MED
INTRAVENOUS | Status: AC
  Administered 2019-01-12: 2.5 [IU]/h via INTRAVENOUS
  Filled 2019-01-12: qty 1000

## 2019-01-12 MED ORDER — FERROUS SULFATE 325 (65 FE) MG PO TABS
325.0000 mg | ORAL_TABLET | Freq: Every day | ORAL | Status: DC
  Administered 2019-01-13: 325 mg via ORAL
  Filled 2019-01-12: qty 1

## 2019-01-12 MED ORDER — DOCUSATE SODIUM 100 MG PO CAPS
100.0000 mg | ORAL_CAPSULE | Freq: Every day | ORAL | Status: DC
  Administered 2019-01-12 – 2019-01-13 (×2): 100 mg via ORAL
  Filled 2019-01-12 (×2): qty 1

## 2019-01-12 MED ORDER — METHYLERGONOVINE MALEATE 0.2 MG/ML IJ SOLN
INTRAMUSCULAR | Status: AC
  Administered 2019-01-12: 0.2 mg
  Filled 2019-01-12: qty 1

## 2019-01-12 MED ORDER — MEDROXYPROGESTERONE ACETATE 150 MG/ML IM SUSP
150.0000 mg | Freq: Once | INTRAMUSCULAR | Status: AC
  Administered 2019-01-13: 150 mg via INTRAMUSCULAR
  Filled 2019-01-12: qty 1

## 2019-01-12 NOTE — Progress Notes (Signed)
Post Partum Day 1 Subjective: no complaints, up ad lib, voiding, tolerating PO and + flatus. Passed several large clots overnight and felt dizzy once but bleeding has since slowed down and denies any dizziness or lightheadedness with standing or ambulation now.   Objective: Blood pressure 118/84, pulse 79, temperature 97.9 F (36.6 C), temperature source Oral, resp. rate 16, height 5' (1.524 m), weight 79.4 kg, last menstrual period 04/02/2018, SpO2 100 %, unknown if currently breastfeeding.  Physical Exam:  General: alert, cooperative and no distress Lochia: appropriate Uterine Fundus: firm DVT Evaluation: No cords or calf tenderness. No significant calf/ankle edema.  Recent Labs    01/11/19 0733 01/12/19 0710  HGB 10.0* 7.9*  HCT 32.4* 24.9*    Assessment/Plan: Plan for discharge tomorrow, Breastfeeding and Contraception - interval BTL, will get PP Depo IM before discharge  Increased Lochia overnight - now resolved after additional dose of IM methergine, Hg dropped from 10 to 7.9 which patient is asymptomatic of this morning with normal vital signs. Starting iron supplement with bowel regimen. Continue to monitor   LOS: 1 day   Burman Nieves, MD Family Medicine Resident  01/12/2019, 9:11 AM

## 2019-01-12 NOTE — Progress Notes (Signed)
At 0200, patient called out to nurse's station and stated that she "was bleeding a lot." Myself, Alyssa Pugleasa RN, and Darrick Grinder RN arrive to room. Patient is hunched over bed and there is blood on the floor and on the bed. Patient is instructed to get back in bed. Vitals are stable. Fundus is firm and deviated to the left and patient has a trickle. Patient has some small clots on pad. Patient is ambulated to restroom with assistance. She passes a clot while urinating. She stands back up to go back to bed but states she felt "lightheaded and heard ringing in her ears." She is sat back down on the toilet, ammonia is used under nose. She states she "feels better" after the ammonia. A stedy is used to get her back to bed. Patient's fundus is rubbed again and is now midline and still firm. She continues to have a trickle of blood. Dr. Vergie Living is called and notified. Vitals continue to be stable. Fundus is rubbed until Dr. Vergie Living comes to see patient. He orders methergine and pitosin which are given and started. QBL is 574 at 0220.  Venida Jarvis, RN

## 2019-01-12 NOTE — Anesthesia Postprocedure Evaluation (Signed)
Anesthesia Post Note  Patient: Allison Long  Procedure(s) Performed: AN AD HOC LABOR EPIDURAL     Patient location during evaluation: Mother Baby Anesthesia Type: Epidural Level of consciousness: awake and alert Pain management: pain level controlled Vital Signs Assessment: post-procedure vital signs reviewed and stable Respiratory status: spontaneous breathing Cardiovascular status: stable and blood pressure returned to baseline Postop Assessment: no headache, no backache, epidural receding, no apparent nausea or vomiting, able to ambulate, adequate PO intake and patient able to bend at knees Anesthetic complications: no    Last Vitals:  Vitals:   01/12/19 0430 01/12/19 0624  BP: (!) 126/95 118/84  Pulse: 84 79  Resp:  16  Temp:    SpO2: 100%     Last Pain:  Vitals:   01/12/19 0623  TempSrc:   PainSc: 4    Pain Goal: Patients Stated Pain Goal: 0 (01/11/19 0643)                 Doreene Burke

## 2019-01-12 NOTE — Progress Notes (Signed)
At 0230, patient's fundus is rubbed again by [facility] RN and Darrick Grinder, RN. She had a trickle and passed 3 large clots, 2 which were almost the size of baseballs. Pad is weighed and QBL from that is 320. Dr. Vergie Living is notified and he states to continue to monitor patient.  Venida Jarvis, RN

## 2019-01-12 NOTE — Progress Notes (Signed)
CTSP for VB PP  VS normal and stable and bladder now feels midline after having patient void.  When I came to assess EBL at approximatetly and bleeding improved and methergine not given yet. Firm fundus below the umbilicus at -4 and slow trickle with deep palpation.   Will give IM methergine dose and give another 40U of pitocin IV and recheck CBC in a few hours  Cornelia Copa MD Attending Center for [employer] (Faculty Practice) 01/12/2019 Time: 0230

## 2019-01-13 MED ORDER — IBUPROFEN 600 MG PO TABS: 600.0000 mg | ORAL_TABLET | Freq: Four times a day (QID) | ORAL | 0 refills | Status: DC

## 2019-01-13 NOTE — Discharge Instructions (Signed)
Vaginal Delivery, Care After °Refer to this sheet in the next few weeks. These instructions provide you with information about caring for yourself after vaginal delivery. Your health care provider may also give you more specific instructions. Your treatment has been planned according to current medical practices, but problems sometimes occur. Call your health care provider if you have any problems or questions. °What can I expect after the procedure? °After vaginal delivery, it is common to have: °· Some bleeding from your vagina. °· Soreness in your abdomen, your vagina, and the area of skin between your vaginal opening and your anus (perineum). °· Pelvic cramps. °· Fatigue. °Follow these instructions at home: °Medicines °· Take over-the-counter and prescription medicines only as told by your health care provider. °· If you were prescribed an antibiotic medicine, take it as told by your health care provider. Do not stop taking the antibiotic until it is finished. °Driving ° °· Do not drive or operate heavy machinery while taking prescription pain medicine. °· Do not drive for 24 hours if you received a sedative. °Lifestyle °· Do not drink alcohol. This is especially important if you are breastfeeding or taking medicine to relieve pain. °· Do not use tobacco products, including cigarettes, chewing tobacco, or e-cigarettes. If you need help quitting, ask your health care provider. °Eating and drinking °· Drink at least 8 eight-ounce glasses of water every day unless you are told not to by your health care provider. If you choose to breastfeed your baby, you may need to drink more water than this. °· Eat high-fiber foods every day. These foods may help prevent or relieve constipation. High-fiber foods include: °? Whole grain cereals and breads. °? Brown rice. °? Beans. °? Fresh fruits and vegetables. °Activity °· Return to your normal activities as told by your health care provider. Ask your health care provider what  activities are safe for you. °· Rest as much as possible. Try to rest or take a nap when your baby is sleeping. °· Do not lift anything that is heavier than your baby or 10 lb (4.5 kg) until your health care provider says that it is safe. °· Talk with your health care provider about when you can engage in sexual activity. This may depend on your: °? Risk of infection. °? Rate of healing. °? Comfort and desire to engage in sexual activity. °Vaginal Care °· If you have an episiotomy or a vaginal tear, check the area every day for signs of infection. Check for: °? More redness, swelling, or pain. °? More fluid or blood. °? Warmth. °? Pus or a bad smell. °· Do not use tampons or douches until your health care provider says this is safe. °· Watch for any blood clots that may pass from your vagina. These may look like clumps of dark red, brown, or black discharge. °General instructions °· Keep your perineum clean and dry as told by your health care provider. °· Wear loose, comfortable clothing. °· Wipe from front to back when you use the toilet. °· Ask your health care provider if you can shower or take a bath. If you had an episiotomy or a perineal tear during labor and delivery, your health care provider may tell you not to take baths for a certain length of time. °· Wear a bra that supports your breasts and fits you well. °· If possible, have someone help you with household activities and help care for your baby for at least a few days after you   leave the hospital. °· Keep all follow-up visits for you and your baby as told by your health care provider. This is important. °Contact a health care provider if: °· You have: °? Vaginal discharge that has a bad smell. °? Difficulty urinating. °? Pain when urinating. °? A sudden increase or decrease in the frequency of your bowel movements. °? More redness, swelling, or pain around your episiotomy or vaginal tear. °? More fluid or blood coming from your episiotomy or vaginal  tear. °? Pus or a bad smell coming from your episiotomy or vaginal tear. °? A fever. °? A rash. °? Little or no interest in activities you used to enjoy. °? Questions about caring for yourself or your baby. °· Your episiotomy or vaginal tear feels warm to the touch. °· Your episiotomy or vaginal tear is separating or does not appear to be healing. °· Your breasts are painful, hard, or turn red. °· You feel unusually sad or worried. °· You feel nauseous or you vomit. °· You pass large blood clots from your vagina. If you pass a blood clot from your vagina, save it to show to your health care provider. Do not flush blood clots down the toilet without having your health care provider look at them. °· You urinate more than usual. °· You are dizzy or light-headed. °· You have not breastfed at all and you have not had a menstrual period for 12 weeks after delivery. °· You have stopped breastfeeding and you have not had a menstrual period for 12 weeks after you stopped breastfeeding. °Get help right away if: °· You have: °? Pain that does not go away or does not get better with medicine. °? Chest pain. °? Difficulty breathing. °? Blurred vision or spots in your vision. °? Thoughts about hurting yourself or your baby. °· You develop pain in your abdomen or in one of your legs. °· You develop a severe headache. °· You faint. °· You bleed from your vagina so much that you fill two sanitary pads in one hour. °This information is not intended to replace advice given to you by your health care provider. Make sure you discuss any questions you have with your health care provider. °Document Released: 09/28/2000 Document Revised: 03/14/2016 Document Reviewed: 10/16/2015 °Elsevier Interactive Patient Education © 2019 [employer] ° °

## 2019-01-14 ENCOUNTER — Inpatient Hospital Stay (HOSPITAL_COMMUNITY)
Admission: AD | Admit: 2019-01-14 | Payer: Medicaid Other | Source: Home / Self Care | Admitting: Obstetrics and Gynecology

## 2019-01-14 ENCOUNTER — Inpatient Hospital Stay (HOSPITAL_COMMUNITY): Payer: Medicaid Other

## 2019-02-09 ENCOUNTER — Encounter: Payer: Self-pay | Admitting: Obstetrics & Gynecology

## 2019-02-09 ENCOUNTER — Ambulatory Visit: Payer: Medicaid Other | Admitting: Advanced Practice Midwife

## 2019-02-09 ENCOUNTER — Ambulatory Visit (INDEPENDENT_AMBULATORY_CARE_PROVIDER_SITE_OTHER): Payer: Medicaid Other | Admitting: Obstetrics & Gynecology

## 2019-02-09 ENCOUNTER — Other Ambulatory Visit: Payer: Self-pay

## 2019-02-09 NOTE — Progress Notes (Signed)
TELEHEALTH WEBEX POSTPARTUM VISIT ENCOUNTER NOTE  I connected with@ on 02/09/19 at  9:30 AM EDT via WebEx at home and verified that I am speaking with the correct person using two identifiers.   I discussed the limitations, risks, security and privacy concerns of performing an evaluation and management service by telephone and the availability of in person appointments. I also discussed with the patient that there may be a patient responsible charge related to this service. The patient expressed understanding and agreed to proceed.  Appointment Date: 02/09/2019  [facility] Clinic: [facility]  Chief Complaint:  Chief Complaint  Patient presents with  . Routine Prenatal Visit    History of Present Illness: Allison Long is a 28 y.o. African-American [id number] (No LMP recorded.), seen for the above chief complaint.  She is s/p normal spontaneous vaginal delivery on 01/11/19 at 40w 4d weeks; she was discharged to home on 01/15/2019. No postpartum complaints.Allison Long is doing well unremarkable. Vaginal bleeding or discharge: spotting. Mode of feeding infant: Bottle Intercourse: No  Contraception: Depo-Provera PP depression s/s: No .  Any bowel or bladder issues: No  Pap smear: no abnormalities (04/03/2016)  Review of Systems: Positive for spotting. Her 12 point review of systems is negative or as noted in the History of Present Illness.  There are no active problems to display for this patient.  Medications None  Allergies Patient has no known allergies.  Physical Exam:  There were no vitals taken for this visit.  General:  Alert, oriented and cooperative. Patient is in no acute distress.  Mental Status: Normal mood and affect. Normal behavior. Normal judgment and thought content.   Respiratory: Normal respiratory effort noted, no problems with respiration noted  Rest of physical exam deferred due to type of encounter  PP Depression Screening:   Edinburgh Postnatal Depression Scale -  02/09/19 0923      Edinburgh Postnatal Depression Scale:  In the Past 7 Days   I have been able to laugh and see the funny side of things.  0    I have looked forward with enjoyment to things.  0    I have blamed myself unnecessarily when things went wrong.  0    I have been anxious or worried for no good reason.  0    I have felt scared or panicky for no good reason.  0    Things have been getting on top of me.  2    I have been so unhappy that I have had difficulty sleeping.  2    I have felt sad or miserable.  0    I have been so unhappy that I have been crying.  0    The thought of harming myself has occurred to me.  0    Edinburgh Postnatal Depression Scale Total  4      Assessment:Patient is a 28 y.o. [id number] who is 5 weeks postpartum from a normal spontaneous vaginal delivery.  She is doing very well.   Plan: Normal postpartum visit RTC as needed  I discussed the assessment and treatment plan with the patient. The patient was provided an opportunity to ask questions and all were answered. The patient agreed with the plan and demonstrated an understanding of the instructions.   The patient was advised to call back or seek an in-person evaluation/go to the ED for any concerning postpartum symptoms.  I provided 10 minutes of face-to-face time during this encounter.   Jaynie Collins, MD  [facility]

## 2019-02-09 NOTE — Patient Instructions (Signed)
Return to clinic for any scheduled appointments or for any gynecologic concerns as needed.   

## 2019-04-14 ENCOUNTER — Telehealth: Payer: Self-pay

## 2019-04-14 NOTE — Telephone Encounter (Signed)
TC from pt requesting BTL. Pt info given to scheduling to make appt for BTL consult.

## 2019-04-21 ENCOUNTER — Telehealth (INDEPENDENT_AMBULATORY_CARE_PROVIDER_SITE_OTHER): Payer: Medicaid Other | Admitting: Obstetrics & Gynecology

## 2019-04-21 ENCOUNTER — Other Ambulatory Visit: Payer: Self-pay

## 2019-04-21 ENCOUNTER — Encounter: Payer: Self-pay | Admitting: Obstetrics & Gynecology

## 2019-04-21 VITALS — Ht 60.0 in | Wt 160.0 lb

## 2019-04-21 DIAGNOSIS — Z3009 Encounter for other general counseling and advice on contraception: Secondary | ICD-10-CM | POA: Diagnosis not present

## 2019-04-21 NOTE — Progress Notes (Signed)
Patient ID: Allison Long, female   DOB: December 16, 1990, 28 y.o.   MRN: [id number]   Telemedicine visit  28 y.o. [id number] with undesired fertility desires permanent sterilization. Risks and benefits of laparoscopic tubal sterilization procedure was discussed with the patient including permanence of method, bleeding, infection, injury to surrounding organs, anesthesia and need for additional procedures. Risk failure of 0.5-1% with increased risk of ectopic gestation if pregnancy occurs was also discussed with patient. Patient verbalized understanding and all questions were answered. Patient desires surgical management.  The risks of surgery were discussed in detail with the patient including but not limited to: bleeding which may require transfusion or reoperation; infection which may require prolonged hospitalization or re-hospitalization and antibiotic therapy; injury to bowel, bladder, ureters and major vessels or other surrounding organs; need for additional procedures including laparotomy; thromboembolic phenomenon, incisional problems and other postoperative or anesthesia complications.  Patient was told that the likelihood that her condition and symptoms will be treated effectively with this surgical management was very high; the postoperative expectations were also discussed in detail. The patient also understands the alternative treatment options which were discussed in full. All questions were answered.  She was told that she will be contacted by our surgical scheduler regarding the time and date of her surgery; routine preoperative instructions of having nothing to eat or drink after midnight on the day prior to surgery and also coming to the hospital 1.5 hours prior to her time of surgery were also emphasized.  She was told she may be called for a preoperative appointment about a week prior to surgery and will be given further preoperative instructions at that visit. Printed patient education handouts  about the procedure were given to the patient to review at home.   [name] MD 04/21/2019

## 2019-04-21 NOTE — Patient Instructions (Signed)

## 2019-04-21 NOTE — Progress Notes (Addendum)
I connected with  Allison Long on 04/21/19 by a video enabled telemedicine application and verified that I am speaking with the correct person using two identifiers.  Patient is unable to access her MyChart Account.   TeleVisit GYN, consulting for BTL.

## 2019-04-24 NOTE — Progress Notes (Signed)
Message left for Mrs Hobday to return call to schedule Covid 19 testing prior to her procedure 04/28/19.

## 2019-04-27 NOTE — Progress Notes (Signed)
Spoke with Allison Long this morning to schedule her to come in for Covid 19 screening. She stated she has decided to cancel her surgery tomorrow with Dr Roselie Awkward and she would call her Doctors's office. I will update Dr Alonna Minium office of the patients decision.

## 2019-04-28 ENCOUNTER — Ambulatory Visit: Admit: 2019-04-28 | Payer: Medicaid Other | Admitting: Obstetrics & Gynecology

## 2019-04-28 SURGERY — LIGATION, FALLOPIAN TUBE, LAPAROSCOPIC
Anesthesia: Choice | Laterality: Bilateral

## 2019-06-09 ENCOUNTER — Encounter: Payer: Self-pay | Admitting: Obstetrics & Gynecology

## 2019-06-09 ENCOUNTER — Other Ambulatory Visit (HOSPITAL_COMMUNITY)
Admission: RE | Admit: 2019-06-09 | Discharge: 2019-06-09 | Disposition: A | Discharge status: Home / Self Care | Payer: Medicaid Other | Source: Ambulatory Visit | Attending: Obstetrics & Gynecology | Admitting: Obstetrics & Gynecology

## 2019-06-09 ENCOUNTER — Ambulatory Visit (INDEPENDENT_AMBULATORY_CARE_PROVIDER_SITE_OTHER): Payer: Medicaid Other | Admitting: Obstetrics & Gynecology

## 2019-06-09 ENCOUNTER — Other Ambulatory Visit: Payer: Self-pay

## 2019-06-09 VITALS — BP 109/71 | HR 76 | Temp 97.2°F | Wt 167.0 lb

## 2019-06-09 DIAGNOSIS — Z30430 Encounter for insertion of intrauterine contraceptive device: Secondary | ICD-10-CM

## 2019-06-09 DIAGNOSIS — H40033 Anatomical narrow angle, bilateral: Secondary | ICD-10-CM | POA: Diagnosis not present

## 2019-06-09 DIAGNOSIS — N938 Other specified abnormal uterine and vaginal bleeding: Secondary | ICD-10-CM | POA: Diagnosis not present

## 2019-06-09 DIAGNOSIS — Z0000 Encounter for general adult medical examination without abnormal findings: Secondary | ICD-10-CM | POA: Insufficient documentation

## 2019-06-09 DIAGNOSIS — Z124 Encounter for screening for malignant neoplasm of cervix: Secondary | ICD-10-CM

## 2019-06-09 DIAGNOSIS — G44209 Tension-type headache, unspecified, not intractable: Secondary | ICD-10-CM | POA: Diagnosis not present

## 2019-06-09 DIAGNOSIS — Z01812 Encounter for preprocedural laboratory examination: Secondary | ICD-10-CM

## 2019-06-09 DIAGNOSIS — Z3202 Encounter for pregnancy test, result negative: Secondary | ICD-10-CM | POA: Diagnosis not present

## 2019-06-09 LAB — POCT URINE PREGNANCY: Preg Test, Ur: NEGATIVE

## 2019-06-09 MED ORDER — LEVONORGESTREL 19.5 MCG/DAY IU IUD
INTRAUTERINE_SYSTEM | Freq: Once | INTRAUTERINE | Status: AC
  Administered 2019-06-09: 1 via INTRAUTERINE

## 2019-06-09 NOTE — Progress Notes (Signed)
   Subjective:    Patient ID: Allison Long, female    DOB: 1991-09-25, 28 y.o.   MRN: [id number]  HPI 89 single P4 (5 months, 2, 4, and 24 yo kids) here today to discuss irregular bleeding since delivery. Prior to delivery she had normal monthly periods that lasted 7 days. She currently is not using contraception but says that she does not want a pregnancy. She has tried OCPs but she forgets them and doesn't want this method. She had a Nexplanon for a year when she was 15 but she bled all the time and had it removed.  She has not had sex for at least 2 months.   Review of Systems Pap 6/17    Objective:   Physical Exam Breathing, conversing, and ambulating normally Well nourished, well hydrated Black female, no apparent distress UPT negative, consent signed, Time out procedure done. Cervix prepped with betadine and grasped with a single tooth tenaculum. Liletta was easily placed and the strings were cut to 3-4 cm. Uterus sounded to 10 cm. She tolerated the procedure well.    Assessment & Plan:  Preventative care- pap smear today DUB- I suspect that this is due to her depo provera shot 3/20. I will check CBC and TSH Contraception- She wants to go ahead with Liletta today. She declines a flu vaccine today String check in 4 weeks

## 2019-06-09 NOTE — Progress Notes (Signed)
RGYN pt here for problem visit today to discuss abnormal vaginal bleeding.   Last Pap: 04/03/2016   CC: pt states has not stopped bleeding since delivery 01/11/2019. Pt states some days may be bleeding may be light/heavy .    Contraception: Given at Hospital  / Depo only received injection in March.

## 2019-06-10 LAB — CBC
Hematocrit: 32.9 % — ABNORMAL LOW (ref 34.0–46.6)
Hemoglobin: 10.4 g/dL — ABNORMAL LOW (ref 11.1–15.9)
MCH: 26.8 pg (ref 26.6–33.0)
MCHC: 31.6 g/dL (ref 31.5–35.7)
MCV: 85 fL (ref 79–97)
Platelets: 408 10*3/uL (ref 150–450)
RBC: 3.88 x10E6/uL (ref 3.77–5.28)
RDW: 14.9 % (ref 11.7–15.4)
WBC: 5 10*3/uL (ref 3.4–10.8)

## 2019-06-10 LAB — TSH: TSH: 1.04 u[IU]/mL (ref 0.450–4.500)

## 2019-06-11 LAB — CYTOLOGY - PAP
Chlamydia: NEGATIVE
Diagnosis: NEGATIVE
Neisseria Gonorrhea: NEGATIVE

## 2019-07-24 DIAGNOSIS — H04123 Dry eye syndrome of bilateral lacrimal glands: Secondary | ICD-10-CM | POA: Diagnosis not present

## 2019-07-24 DIAGNOSIS — H5213 Myopia, bilateral: Secondary | ICD-10-CM | POA: Diagnosis not present

## 2019-08-11 DIAGNOSIS — H5213 Myopia, bilateral: Secondary | ICD-10-CM | POA: Diagnosis not present

## 2019-08-11 DIAGNOSIS — H1013 Acute atopic conjunctivitis, bilateral: Secondary | ICD-10-CM | POA: Diagnosis not present

## 2019-10-04 DIAGNOSIS — H16223 Keratoconjunctivitis sicca, not specified as Sjogren's, bilateral: Secondary | ICD-10-CM | POA: Diagnosis not present

## 2019-10-22 ENCOUNTER — Telehealth: Payer: Self-pay

## 2019-10-22 NOTE — Telephone Encounter (Signed)
Pt has IUD  recently having irregular bleeding No recent intercourse, no severe pain Pt advised irregular bleeding can be expected with contraception for 3-6 months Pt advised to monitor for now. However if Sx's get worst to seek medical attention or schedule an appt in the office.   Pt voiced understanding.

## 2019-12-19 IMAGING — US US MFM OB COMP +14 WKS
1 series · 14 of 28 positions shown · non-contrast
Comparison: none

[Series 1: us mfm ob comp +14 wks · 98 acquisitions, 14 frames shown]
[im 4/98]
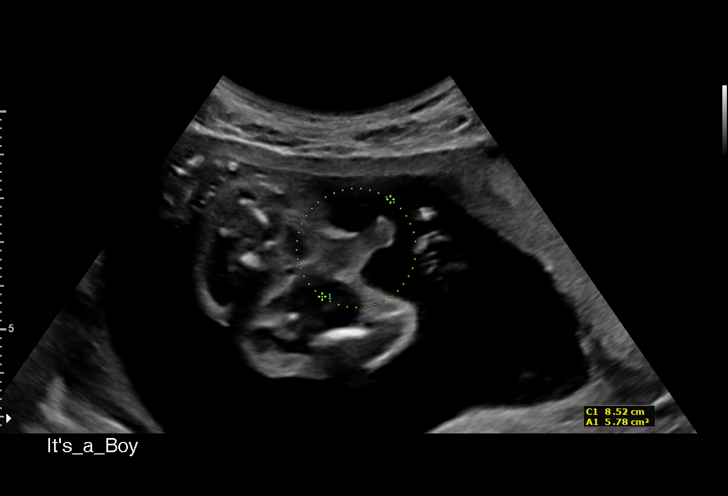
[im 11/98]
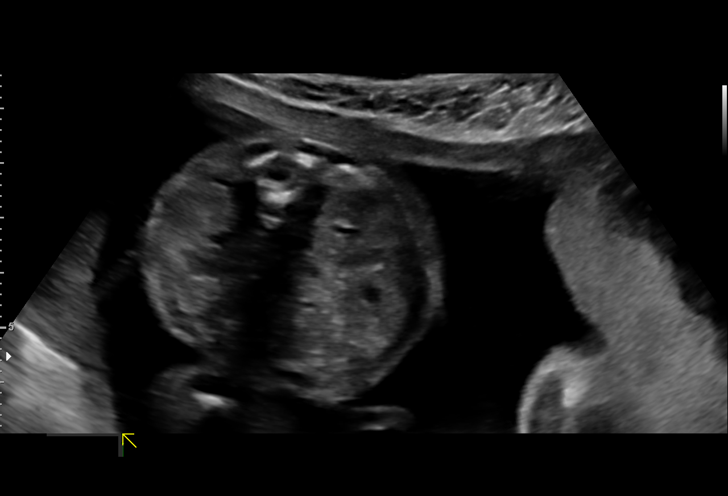
[im 18/98]
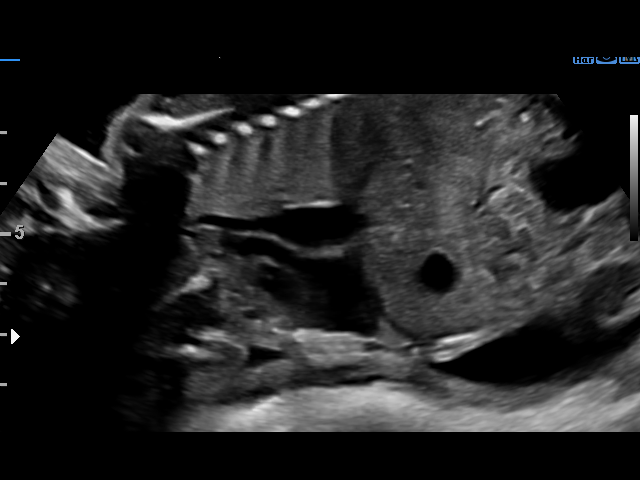
[im 26/98]
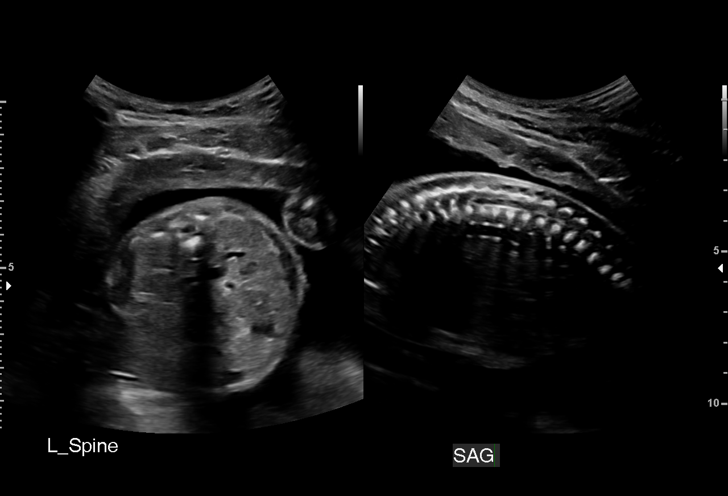
[im 33/98]
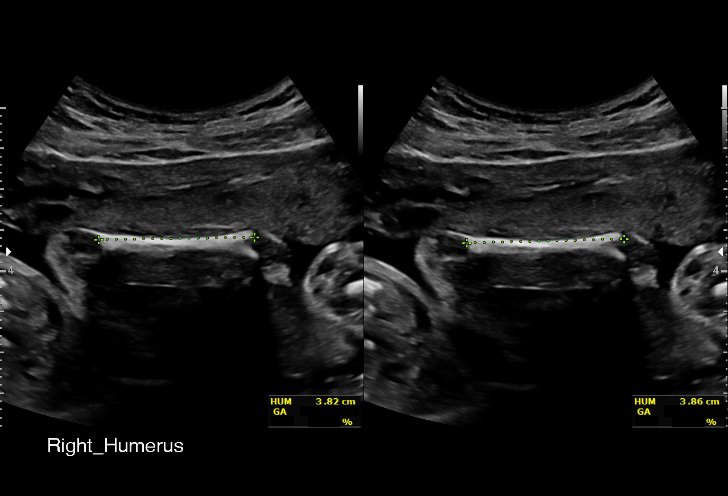
[im 40/98]
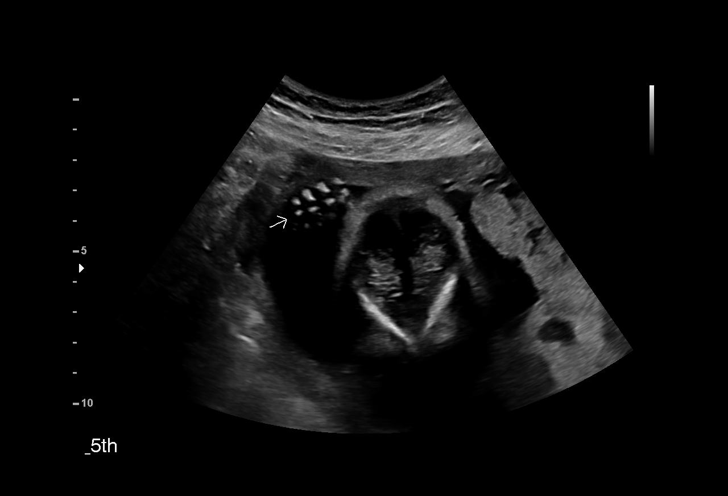
[im 47/98]
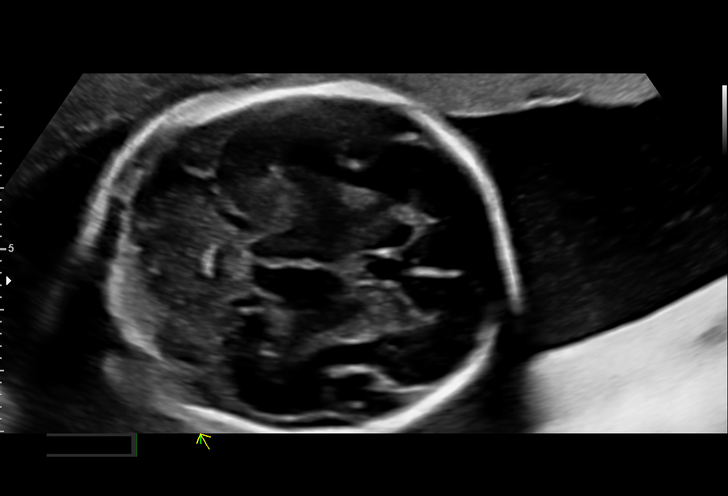
[im 54/98]
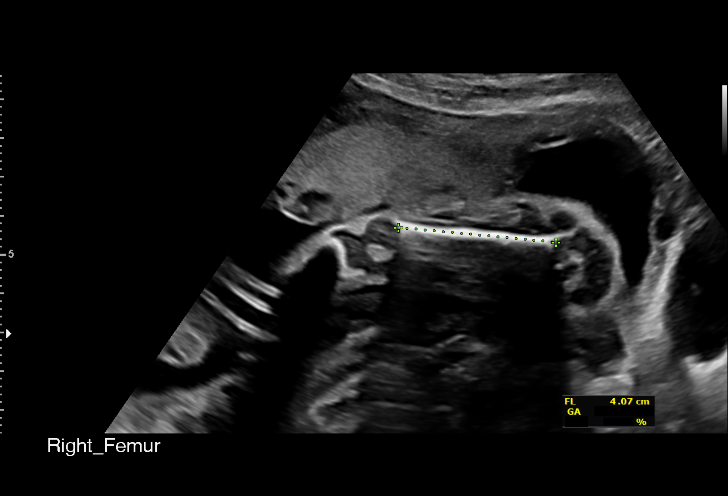
[im 62/98]
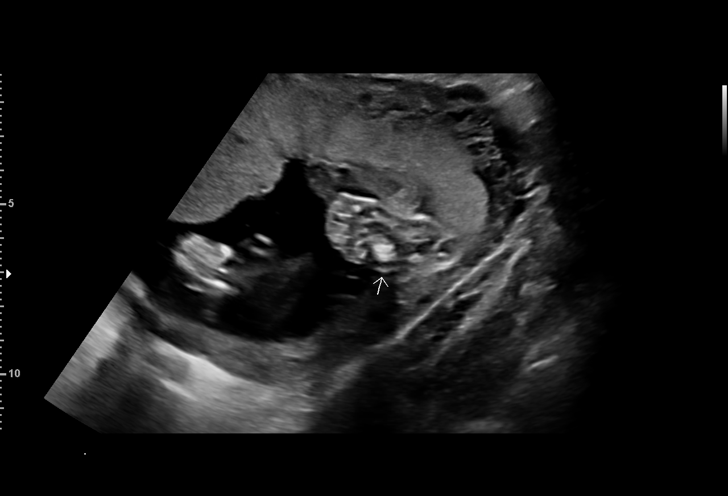
[im 69/98]
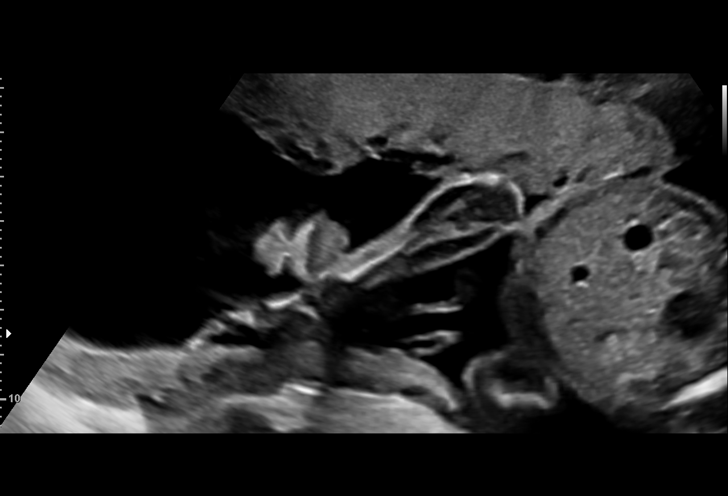
[im 76/98]
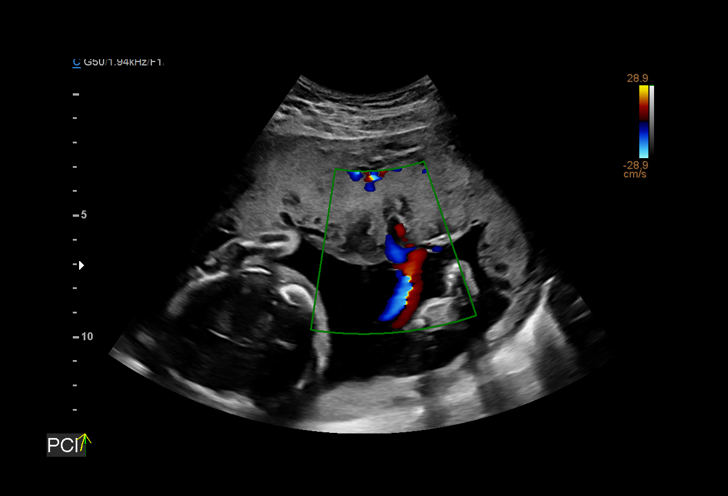
[im 83/98]
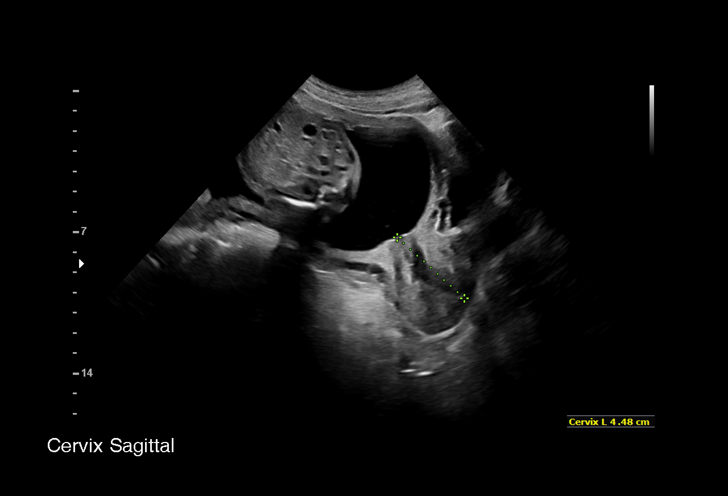
[im 90/98]
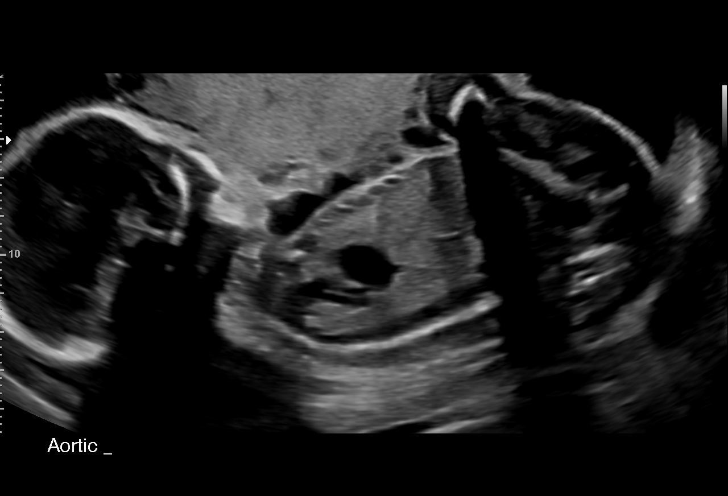
[im 98/98]
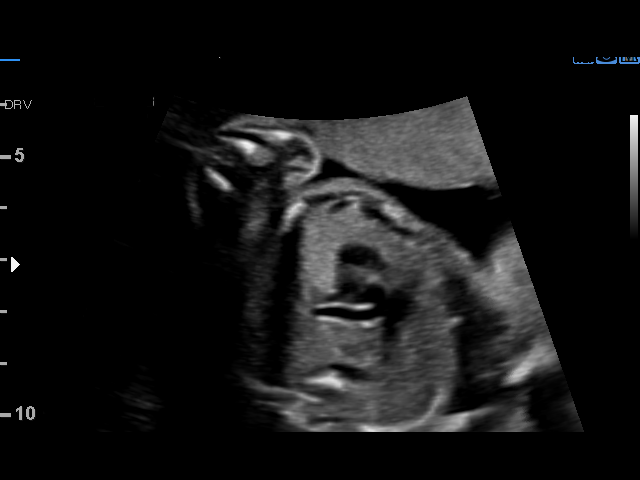

[14 of 28 positions shown; findings below may reference images not displayed]

1  US MFM OB COMP + 14 WK               76805.01     JALISSA BONDOC
 ----------------------------------------------------------------------

 ----------------------------------------------------------------------
Indications

  Encounter for antenatal screening for
  malformations
  24 weeks gestation of pregnancy
 ----------------------------------------------------------------------
Vital Signs

 BMI:
Fetal Evaluation

 Num Of Fetuses:          1
 Fetal Heart Rate(bpm):   142
 Cardiac Activity:        Observed
 Presentation:            Breech
 Placenta:                Anterior
 P. Cord Insertion:       Visualized

 Amniotic Fluid
 AFI FV:      Within normal limits

                             Largest Pocket(cm)

Biometry

 BPD:      56.2  mm     G. Age:  23w 1d         13  %    CI:        72.34   %    70 - 86
                                                         FL/HC:       19.6  %    18.7 -
 HC:      210.2  mm     G. Age:  23w 1d          7  %    HC/AC:       1.11       1.05 -
 AC:      190.1  mm     G. Age:  23w 5d         29  %    FL/BPD:      73.3  %    71 - 87
 FL:       41.2  mm     G. Age:  23w 3d         17  %    FL/AC:       21.7  %    20 - 24
 HUM:      38.6  mm     G. Age:  23w 5d         31  %
 CER:      26.9  mm     G. Age:  24w 3d         57  %
 NFT:       5.7  mm
 LV:        4.1  mm
 CM:        4.6  mm
 Est. FW:     602   gm     1 lb 5 oz     38  %
OB History

 Gravidity:    5         Term:   3         SAB:   1
 Living:       3
Gestational Age

 LMP:           24w 1d        Date:  04/02/18                 EDD:   01/07/19
 U/S Today:     23w 3d                                        EDD:   01/12/19
 Best:          24w 1d     Det. By:  LMP  (04/02/18)          EDD:   01/07/19
Anatomy

 Cranium:               Appears normal         Aortic Arch:            Appears normal
 Cavum:                 Appears normal         Ductal Arch:            Appears normal
 Ventricles:            Appears normal         Diaphragm:              Appears normal
 Choroid Plexus:        Appears normal         Stomach:                Appears normal, left
                                                                       sided
 Cerebellum:            Appears normal         Abdomen:                Appears normal
 Posterior Fossa:       Appears normal         Abdominal Wall:         Appears nml (cord
                                                                       insert, abd wall)
 Nuchal Fold:           Appears normal         Cord Vessels:           Appears normal (3
                                                                       vessel cord)
 Face:                  Appears normal         Kidneys:                Appear normal
                        (orbits and profile)
 Lips:                  Appears normal         Bladder:                Appears normal
 Thoracic:              Appears normal         Spine:                  Appears normal
 Heart:                 Appears normal         Upper Extremities:      Appears normal
                        (4CH, axis, and situs
 RVOT:                  Appears normal         Lower Extremities:      Appears normal
 LVOT:                  Appears normal

 Other:  Male gender.  Heels and LT 5th digit visualized.
Cervix Uterus Adnexa

 Cervix
 Length:           4.48  cm.
 Normal appearance by transabdominal scan.

 Uterus
 No abnormality visualized.

 Left Ovary
 Size(cm)       3.4  x   1.7    x  2.4       Vol(ml):
 Within normal limits.

 Right Ovary
 Size(cm)       3.6  x   1.8    x  2.7       Vol(ml):
 Within normal limits.
Impression

 Normal interval growth.  No ultrasonic evidence of structural
 fetal anomalies.
Recommendations

 Follow up as clinically indicated

## 2020-04-10 DIAGNOSIS — H1013 Acute atopic conjunctivitis, bilateral: Secondary | ICD-10-CM | POA: Diagnosis not present

## 2021-05-02 ENCOUNTER — Ambulatory Visit (HOSPITAL_COMMUNITY)
Admission: EM | Admit: 2021-05-02 | Discharge: 2021-05-02 | Discharge status: Against Medical Advice | Payer: Medicaid Other

## 2021-05-02 ENCOUNTER — Other Ambulatory Visit: Payer: Self-pay

## 2021-05-02 DIAGNOSIS — Z5321 Procedure and treatment not carried out due to patient leaving prior to being seen by health care provider: Secondary | ICD-10-CM

## 2021-05-03 ENCOUNTER — Encounter (HOSPITAL_COMMUNITY): Payer: Self-pay | Admitting: *Deleted

## 2021-05-03 ENCOUNTER — Other Ambulatory Visit: Payer: Self-pay

## 2021-05-03 ENCOUNTER — Ambulatory Visit (INDEPENDENT_AMBULATORY_CARE_PROVIDER_SITE_OTHER): Payer: Medicaid Other

## 2021-05-03 ENCOUNTER — Ambulatory Visit (HOSPITAL_COMMUNITY)
Admission: EM | Admit: 2021-05-03 | Discharge: 2021-05-03 | Disposition: A | Discharge status: Home / Self Care | Payer: Medicaid Other | Attending: Physician Assistant | Admitting: Physician Assistant

## 2021-05-03 DIAGNOSIS — R0789 Other chest pain: Secondary | ICD-10-CM

## 2021-05-03 DIAGNOSIS — R0782 Intercostal pain: Secondary | ICD-10-CM | POA: Diagnosis not present

## 2021-05-03 DIAGNOSIS — R0781 Pleurodynia: Secondary | ICD-10-CM

## 2021-05-03 DIAGNOSIS — S299XXA Unspecified injury of thorax, initial encounter: Secondary | ICD-10-CM | POA: Diagnosis not present

## 2021-05-03 MED ORDER — NAPROXEN 375 MG PO TABS: 375.0000 mg | ORAL_TABLET | Freq: Two times a day (BID) | ORAL | 0 refills | Status: DC

## 2021-05-03 MED ORDER — BACLOFEN 10 MG PO TABS: 10.0000 mg | ORAL_TABLET | Freq: Two times a day (BID) | ORAL | 0 refills | Status: DC | PRN
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
## 2021-05-03 NOTE — ED Triage Notes (Signed)
Pt reports rib pain LT started 4 days ago.

## 2021-05-03 NOTE — Discharge Instructions (Addendum)
Your x-ray was normal with no evidence of a rib fracture.  Please start Naprosyn to help with pain and inflammation.  You should not take NSAIDs with this medication due to risk of GI bleeding including aspirin, ibuprofen/Advil, naproxen/Aleve.  You can take Tylenol for breakthrough pain.  I have also called in a muscle relaxer known as baclofen to help with pain.  This can make you sleepy do not drive or drink alcohol while taking it.  If anything worsens or your symptoms or not improving please return for reevaluation.

## 2021-07-25 ENCOUNTER — Other Ambulatory Visit (HOSPITAL_COMMUNITY)
Admission: RE | Admit: 2021-07-25 | Discharge: 2021-07-25 | Disposition: A | Discharge status: Home / Self Care | Payer: Medicaid Other | Source: Ambulatory Visit | Attending: Obstetrics & Gynecology | Admitting: Obstetrics & Gynecology

## 2021-07-25 ENCOUNTER — Encounter: Payer: Self-pay | Admitting: Obstetrics & Gynecology

## 2021-07-25 ENCOUNTER — Ambulatory Visit (INDEPENDENT_AMBULATORY_CARE_PROVIDER_SITE_OTHER): Payer: Medicaid Other | Admitting: Obstetrics & Gynecology

## 2021-07-25 ENCOUNTER — Other Ambulatory Visit: Payer: Self-pay

## 2021-07-25 VITALS — BP 105/72 | HR 65 | Wt 129.6 lb

## 2021-07-25 DIAGNOSIS — Z30432 Encounter for removal of intrauterine contraceptive device: Secondary | ICD-10-CM | POA: Diagnosis not present

## 2021-07-25 DIAGNOSIS — Z01419 Encounter for gynecological examination (general) (routine) without abnormal findings: Secondary | ICD-10-CM | POA: Diagnosis not present

## 2021-07-25 DIAGNOSIS — N898 Other specified noninflammatory disorders of vagina: Secondary | ICD-10-CM | POA: Diagnosis not present

## 2021-07-25 DIAGNOSIS — Z975 Presence of (intrauterine) contraceptive device: Secondary | ICD-10-CM

## 2021-07-25 NOTE — Progress Notes (Signed)
Patient ID: Fredia Beets, female   DOB: 03-21-1991, 30 y.o.   MRN: [id number]  Chief Complaint  Patient presents with   Annual Exam   Remove IUD HPI NYOMI HOWSER is a 30 y.o. female.  [id number] No LMP recorded. (Menstrual status: IUD). She has done well with Liletta but wants to conceive, requests removal HPI  Past Medical History:  Diagnosis Date   Anemia    Chlamydia    x 2    Past Surgical History:  Procedure Laterality Date   NO PAST SURGERIES      Family History  Problem Relation Age of Onset   Thyroid disease Mother    Cancer Father        colon    Social History Social History   Tobacco Use   Smoking status: Former    Packs/day: 0.50    Types: Cigarettes   Smokeless tobacco: Never   Tobacco comments:    not during pregnancy  Vaping Use   Vaping Use: Never used  Substance Use Topics   Alcohol use: Not Currently    Comment: occasional   Drug use: Not Currently    Frequency: 7.0 times per week    Types: Marijuana    Comment: none during pregnancy    No Known Allergies  Current Outpatient Medications  Medication Sig Dispense Refill   baclofen (LIORESAL) 10 MG tablet Take 1 tablet (10 mg total) by mouth 2 (two) times daily as needed for muscle spasms. (Patient not taking: Reported on 07/25/2021) 20 each 0   naproxen (NAPROSYN) 375 MG tablet Take 1 tablet (375 mg total) by mouth 2 (two) times daily. (Patient not taking: Reported on 07/25/2021) 20 tablet 0   No current facility-administered medications for this visit.    Review of Systems Review of Systems  Constitutional: Negative.   Respiratory: Negative.    Cardiovascular: Negative.   Genitourinary: Negative.   Neurological: Negative.    Blood pressure 105/72, pulse 65, weight 129 lb 9.6 oz (58.8 kg).  Physical Exam Physical Exam Vitals and nursing note reviewed. Exam conducted with a chaperone present.  Constitutional:      Appearance: Normal appearance.  HENT:     Head:  Normocephalic.  Pulmonary:     Effort: Pulmonary effort is normal.  Chest:  Breasts:    Right: Normal.     Left: Normal.  Abdominal:     General: Abdomen is flat.     Palpations: Abdomen is soft.  Genitourinary:    General: Normal vulva.     Exam position: Lithotomy position.     Vagina: Normal.     Cervix: Normal.     Uterus: Normal.      Adnexa: Right adnexa normal and left adnexa normal.  Musculoskeletal:        General: Normal range of motion.     Cervical back: Normal range of motion.  Neurological:     General: No focal deficit present.     Mental Status: She is alert.  Psychiatric:        Mood and Affect: Mood normal.        Behavior: Behavior normal.    Data Reviewed Pap 2020  Assessment Well woman exam with routine gynecological exam  IUD (intrauterine device) in place  Vaginal odor - Plan: Cervicovaginal ancillary only( [place]) IUD removed  Plan Notify us if she conceives    Scheryl Darter 07/25/2021, 10:23 AM

## 2021-07-25 NOTE — Progress Notes (Signed)
    GYNECOLOGY OFFICE PROCEDURE NOTE  Allison Long is a 30 y.o. [id number] here for Liletta IUD removal. No GYN concerns.  Last pap smear was on 05/2019 and was normal.  IUD Removal  Patient identified, informed consent performed, consent signed.  Patient was in the dorsal lithotomy position, normal external genitalia was noted.  A speculum was placed in the patient's vagina, normal discharge was noted, no lesions. The cervix was visualized, no lesions, no abnormal discharge.  The strings of the IUD were grasped and pulled using ring forceps. The IUD was removed in its entirety.  (.  Patient tolerated the procedure well.    Patient plans for pregnancy soon and she was told to avoid teratogens, take PNV and folic acid.  Routine preventative health maintenance measures emphasized.   Adam Phenix, MD  Obstetrician & Gynecologist, [facility], [facility] [id number] ID: Fredia Beets, female   DOB: [birth year], 30 y.o.   MRN: [id number]

## 2021-07-25 NOTE — Progress Notes (Signed)
Pt is in the office for annual Last pap 06-09-2019 Pt wants IUD removed today, desires to get pregnant  Pt reports vaginal odor GAD-7= 15

## 2021-07-26 LAB — CERVICOVAGINAL ANCILLARY ONLY
Bacterial Vaginitis (gardnerella): NEGATIVE
Candida Glabrata: NEGATIVE
Candida Vaginitis: NEGATIVE
Chlamydia: NEGATIVE
Comment: NEGATIVE
Comment: NEGATIVE
Comment: NEGATIVE
Comment: NEGATIVE
Comment: NEGATIVE
Comment: NORMAL
Neisseria Gonorrhea: NEGATIVE
Trichomonas: NEGATIVE

## 2021-10-15 NOTE — L&D Delivery Note (Addendum)
Delivery Note At 1119 a viable female infant was delivered via SVD, presentation: ROA. Apgars: 8, 9. Weight pending. Shoulder dystocia identified First maneuver: McRoberts performed and unsuccessful. Second maneuver: Attempted delivery of posterior arm and unsuccessful.  Third maneuver: Rubin performed which resulted in delivery of anterior shoulder. Delivery of posterior shoulder and entire baby after improved maternal effort and hooking of both axillas, 40 seconds later.     Placenta status: spontaneously delivered intact with gentle cord traction. Fundus firm with massage and Pitocin. TXA given prophylactically.  Anesthesia: epidural Lacerations: none Suture used for repair: n/a Est. Blood Loss (mL): 68 Placenta to LD Complications SD Cord ph n/a   Mom to postpartum. Baby to Couplet care / Skin to Skin.    Donette Larry, CNM 06/14/2022 11:33 AM

## 2021-12-06 ENCOUNTER — Other Ambulatory Visit: Payer: Self-pay

## 2021-12-06 ENCOUNTER — Ambulatory Visit (INDEPENDENT_AMBULATORY_CARE_PROVIDER_SITE_OTHER): Payer: Medicaid Other

## 2021-12-06 ENCOUNTER — Encounter: Payer: Self-pay | Admitting: Obstetrics

## 2021-12-06 VITALS — BP 105/78 | HR 75 | Ht 60.0 in | Wt 144.0 lb

## 2021-12-06 DIAGNOSIS — Z3201 Encounter for pregnancy test, result positive: Secondary | ICD-10-CM

## 2021-12-06 LAB — POCT URINE PREGNANCY: Preg Test, Ur: POSITIVE — AB

## 2021-12-06 NOTE — Progress Notes (Signed)
Ms. Hunnell presents today for UPT. She has no unusual complaints. LMP: 08/31/2021    OBJECTIVE: Appears well, in no apparent distress.  OB History     Gravida  6   Para  4   Term  4   Preterm  0   AB  1   Living  4      SAB  1   IAB  0   Ectopic  0   Multiple  0   Live Births  4          Home UPT Result:POSITIVE In-Office UPT result: POSITIVE  I have reviewed the patient's medical, obstetrical, social, and family histories, and medications.   ASSESSMENT: Positive pregnancy test LMP  08/31/2021 EDD   06/07/2022 GA      [redacted]w[redacted]d  PLAN Prenatal care to be completed at: [facility]

## 2021-12-06 NOTE — Progress Notes (Signed)
Agree with nurses's documentation of this patient's clinic encounter.  Nazir Hacker L, MD  

## 2021-12-21 DIAGNOSIS — H5213 Myopia, bilateral: Secondary | ICD-10-CM | POA: Diagnosis not present

## 2021-12-26 ENCOUNTER — Telehealth: Payer: Self-pay | Admitting: Emergency Medicine

## 2021-12-26 NOTE — Telephone Encounter (Signed)
Attempted call x3 to patient for New OB Intake Interview. Left HIPAA compliant message to r/c to clinic.  ?

## 2022-01-02 ENCOUNTER — Encounter: Payer: Self-pay | Admitting: Obstetrics and Gynecology

## 2022-01-02 ENCOUNTER — Other Ambulatory Visit: Payer: Self-pay

## 2022-01-02 ENCOUNTER — Ambulatory Visit (INDEPENDENT_AMBULATORY_CARE_PROVIDER_SITE_OTHER): Payer: Medicaid Other | Admitting: Obstetrics and Gynecology

## 2022-01-02 ENCOUNTER — Other Ambulatory Visit (HOSPITAL_COMMUNITY)
Admission: RE | Admit: 2022-01-02 | Discharge: 2022-01-02 | Disposition: A | Discharge status: Home / Self Care | Payer: Medicaid Other | Source: Ambulatory Visit | Attending: Obstetrics and Gynecology | Admitting: Obstetrics and Gynecology

## 2022-01-02 VITALS — BP 102/65 | HR 92 | Wt 153.0 lb

## 2022-01-02 DIAGNOSIS — Z3480 Encounter for supervision of other normal pregnancy, unspecified trimester: Secondary | ICD-10-CM | POA: Insufficient documentation

## 2022-01-02 DIAGNOSIS — Z3A17 17 weeks gestation of pregnancy: Secondary | ICD-10-CM | POA: Diagnosis not present

## 2022-01-02 NOTE — Progress Notes (Signed)
? ?INITIAL PRENATAL VISIT NOTE ? ?Subjective:  ?Allison Long is a 31 y.o. [id number] at [redacted]w[redacted]d by LMP being seen today for her initial prenatal visit. She has an obstetric history significant for SVD x 4. She has a medical history significant for anemia. ? ?Patient reports no complaints.  Contractions: Not present. Vag. Bleeding: None.  Movement: Present. Denies leaking of fluid.  ? ? ?Past Medical History:  ?Diagnosis Date  ? Anemia   ? Chlamydia   ? x 2  ? ? ?Past Surgical History:  ?Procedure Laterality Date  ? NO PAST SURGERIES    ? ? ?OB History  ?Gravida Para Term Preterm AB Living  ?6 4 4  0 1 4  ?SAB IAB Ectopic Multiple Live Births  ?1 0 0 0 4  ?  ?# Outcome Date GA Lbr Len/2nd Weight Sex Delivery Anes PTL Lv  ?6 Current           ?5 Term 01/11/19 [redacted]w[redacted]d 13:31 / 02:01 7 lb 4.1 oz (3.29 kg) M Vag-Spont EPI  LIV  ?4 SAB 2018          ?3 Term 09/06/16 [redacted]w[redacted]d 10:01 / 00:12 6 lb 15.6 oz (3.165 kg) F Vag-Spont EPI  LIV  ?2 Term 08/19/14 [redacted]w[redacted]d 05:50 / 00:17 6 lb 4.7 oz (2.855 kg) F Vag-Spont EPI  LIV  ?1 Term 09/28/11 [redacted]w[redacted]d 14:57 / 01:13 6 lb 7 oz (2.92 kg) M Vag-Spont EPI  LIV  ?   Birth Comments: extra digit beside fifth finger on left hand.  birthmark under right nipple.  ? ? ?Social History  ? ?Socioeconomic History  ? Marital status: Single  ?  Spouse name: Not on file  ? Number of children: Not on file  ? Years of education: Not on file  ? Highest education level: Not on file  ?Occupational History  ? Not on file  ?Tobacco Use  ? Smoking status: Former  ?  Packs/day: 0.50  ?  Types: Cigarettes  ? Smokeless tobacco: Never  ? Tobacco comments:  ?  not during pregnancy  ?Vaping Use  ? Vaping Use: Never used  ?Substance and Sexual Activity  ? Alcohol use: Not Currently  ?  Comment: occasional  ? Drug use: Not Currently  ?  Frequency: 7.0 times per week  ?  Types: Marijuana  ?  Comment: none during pregnancy  ? Sexual activity: Yes  ?  Birth control/protection: None  ?Other Topics Concern  ? Not on file  ?Social  History Narrative  ? Not on file  ? ?Social Determinants of Health  ? ?Financial Resource Strain: Not on file  ?Food Insecurity: Not on file  ?Transportation Needs: Not on file  ?Physical Activity: Not on file  ?Stress: Not on file  ?Social Connections: Not on file  ? ? ?Family History  ?Problem Relation Age of Onset  ? Thyroid disease Mother   ? Cancer Father   ?     colon  ? ? ? ?Current Outpatient Medications:  ?  Prenatal Vit-Fe Fumarate-FA (MULTIVITAMIN-PRENATAL) 27-0.8 MG TABS tablet, Take 1 tablet by mouth daily at 12 noon., Disp: , Rfl:  ? ?No Known Allergies ? ?Review of Systems: Negative except for what is mentioned in HPI. ? ?Objective:  ? ?Vitals:  ? 01/02/22 1455  ?BP: 102/65  ?Pulse: 92  ?Weight: 153 lb (69.4 kg)  ? ? ?Fetal Status: Fetal Heart Rate (bpm): 140   Movement: Present    ? ?Physical Exam: ?BP 102/65  Pulse 92   Wt 153 lb (69.4 kg)   LMP 08/31/2021 (Exact Date)   BMI 29.88 kg/m?  ?CONSTITUTIONAL: Well-developed, well-nourished female in no acute distress.  ?NEUROLOGIC: Alert and oriented to person, place, and time. Normal reflexes, muscle tone coordination. No cranial nerve deficit noted. ?PSYCHIATRIC: Normal mood and affect. Normal behavior. Normal judgment and thought content. ?SKIN: Skin is warm and dry. No rash noted. Not diaphoretic. No erythema. No pallor. ?HENT:  Normocephalic, atraumatic, External right and left ear normal. Oropharynx is clear and moist ?EYES: Conjunctivae and EOM are normal.  ?NECK: Normal range of motion, supple, no masses ?CARDIOVASCULAR: Normal heart rate noted, regular rhythm ?RESPIRATORY: Effort and breath sounds normal, no problems with respiration noted ?BREASTS:deferred ?ABDOMEN: Soft, nontender, nondistended, gravid. ?GU: normal appearing external female genitalia, multiparous, normal appearing cervix, scant white discharge in vagina, no lesions noted, pap taken without incident, chaperone present ?Bimanual: 17 weeks sized uterus, no adnexal tenderness  or palpable lesions noted ?MUSCULOSKELETAL: Normal range of motion. ?EXT:  No edema and no tenderness. 2+ distal pulses. ? ? ?Assessment and Plan:  ?Pregnancy: [id number] at [redacted]w[redacted]d by LMP ? ?1. Supervision of other normal pregnancy, antepartum ?Continue routine care ?Sure LMP, will schedule anatomy scan ?Pt may be interested in BTL ? ?- CBC/D/Plt+RPR+Rh+ABO+RubIgG... ?- Cervicovaginal ancillary only( [place]) ?- Cytology - PAP( [place]) ?- Culture, OB Urine ?- [place] MFM OB COMP + 14 WK; Future ? ?2. [redacted] weeks gestation of pregnancy ? ? ? ?Preterm labor symptoms and general obstetric precautions including but not limited to vaginal bleeding, contractions, leaking of fluid and fetal movement were reviewed in detail with the patient. ? ?Please refer to After Visit Summary for other counseling recommendations.  ? ?Return in about 4 weeks (around 01/30/2022) for ROB, in person. ? ?Griffin Basil ?01/02/2022 ?3:38 PM  ?

## 2022-01-02 NOTE — Addendum Note (Signed)
Addended by: Marya Landry D on: 01/02/2022 04:14 PM ? ? Modules accepted: Orders ? ?

## 2022-01-03 LAB — CBC/D/PLT+RPR+RH+ABO+RUBIGG...
Antibody Screen: NEGATIVE
Basophils Absolute: 0.1 10*3/uL (ref 0.0–0.2)
Basos: 1 %
EOS (ABSOLUTE): 0.2 10*3/uL (ref 0.0–0.4)
Eos: 3 %
HCV Ab: NONREACTIVE
HIV Screen 4th Generation wRfx: NONREACTIVE
Hematocrit: 33.8 % — ABNORMAL LOW (ref 34.0–46.6)
Hemoglobin: 11.4 g/dL (ref 11.1–15.9)
Hepatitis B Surface Ag: NEGATIVE
Immature Grans (Abs): 0 10*3/uL (ref 0.0–0.1)
Immature Granulocytes: 1 %
Lymphocytes Absolute: 2.1 10*3/uL (ref 0.7–3.1)
Lymphs: 30 %
MCH: 30.4 pg (ref 26.6–33.0)
MCHC: 33.7 g/dL (ref 31.5–35.7)
MCV: 90 fL (ref 79–97)
Monocytes Absolute: 0.6 10*3/uL (ref 0.1–0.9)
Monocytes: 9 %
Neutrophils Absolute: 4 10*3/uL (ref 1.4–7.0)
Neutrophils: 56 %
Platelets: 305 10*3/uL (ref 150–450)
RBC: 3.75 x10E6/uL — ABNORMAL LOW (ref 3.77–5.28)
RDW: 12.2 % (ref 11.7–15.4)
RPR Ser Ql: NONREACTIVE
Rh Factor: POSITIVE
Rubella Antibodies, IGG: 19.2 index (ref >0.99)
WBC: 7 10*3/uL (ref 3.4–10.8)

## 2022-01-03 LAB — CERVICOVAGINAL ANCILLARY ONLY
Bacterial Vaginitis (gardnerella): NEGATIVE
Candida Glabrata: NEGATIVE
Candida Vaginitis: NEGATIVE
Chlamydia: NEGATIVE
Comment: NEGATIVE
Comment: NEGATIVE
Comment: NEGATIVE
Comment: NEGATIVE
Comment: NEGATIVE
Comment: NORMAL
Neisseria Gonorrhea: NEGATIVE
Trichomonas: NEGATIVE

## 2022-01-03 LAB — HCV INTERPRETATION

## 2022-01-04 LAB — CYTOLOGY - PAP: Diagnosis: NEGATIVE

## 2022-01-04 LAB — URINE CULTURE, OB REFLEX: Organism ID, Bacteria: NO GROWTH

## 2022-01-04 LAB — CULTURE, OB URINE

## 2022-01-05 LAB — AFP, SERUM, OPEN SPINA BIFIDA
AFP MoM: 1.07
AFP Value: 50.1 ng/mL
Gest. Age on Collection Date: 17.7 weeks
Maternal Age At EDD: 30.3 yr
OSBR Risk 1 IN: 10000
Test Results:: NEGATIVE
Weight: 153 [lb_av]

## 2022-01-26 ENCOUNTER — Ambulatory Visit: Payer: Medicaid Other

## 2022-02-05 ENCOUNTER — Ambulatory Visit: Payer: Medicaid Other | Attending: Obstetrics | Admitting: Obstetrics

## 2022-02-05 ENCOUNTER — Other Ambulatory Visit: Payer: Self-pay | Admitting: *Deleted

## 2022-02-05 ENCOUNTER — Ambulatory Visit: Payer: Medicaid Other | Attending: Obstetrics and Gynecology

## 2022-02-05 ENCOUNTER — Other Ambulatory Visit: Payer: Self-pay | Admitting: Obstetrics and Gynecology

## 2022-02-05 DIAGNOSIS — Z8279 Family history of other congenital malformations, deformations and chromosomal abnormalities: Secondary | ICD-10-CM | POA: Diagnosis not present

## 2022-02-05 DIAGNOSIS — O99012 Anemia complicating pregnancy, second trimester: Secondary | ICD-10-CM | POA: Diagnosis not present

## 2022-02-05 DIAGNOSIS — O359XX0 Maternal care for (suspected) fetal abnormality and damage, unspecified, not applicable or unspecified: Secondary | ICD-10-CM

## 2022-02-05 DIAGNOSIS — Z3480 Encounter for supervision of other normal pregnancy, unspecified trimester: Secondary | ICD-10-CM

## 2022-02-05 DIAGNOSIS — Z3A21 21 weeks gestation of pregnancy: Secondary | ICD-10-CM | POA: Diagnosis not present

## 2022-02-05 DIAGNOSIS — Z363 Encounter for antenatal screening for malformations: Secondary | ICD-10-CM | POA: Diagnosis not present

## 2022-02-05 DIAGNOSIS — Z3492 Encounter for supervision of normal pregnancy, unspecified, second trimester: Secondary | ICD-10-CM

## 2022-02-05 DIAGNOSIS — O283 Abnormal ultrasonic finding on antenatal screening of mother: Secondary | ICD-10-CM | POA: Diagnosis not present

## 2022-02-05 DIAGNOSIS — O358XX0 Maternal care for other (suspected) fetal abnormality and damage, not applicable or unspecified: Secondary | ICD-10-CM | POA: Insufficient documentation

## 2022-02-05 NOTE — Progress Notes (Signed)
MFM Note ? ?Allison Long was seen for a detailed fetal anatomy scan.  The patient only presented for prenatal care recently.  She reports that her sons were all born with polydactyly which they inherited from their father. ? ?She denies any significant past medical history and denies any problems in her current pregnancy.   ? ?She has declined all screening tests for fetal aneuploidy in her current pregnancy. ? ?Based on the fetal biometry measurements obtained today, her EDC was changed to June 14, 2022, making her 21 weeks and 4 days pregnant today.  The patient reports that her prior [facility] of June 07, 2022 was based on an uncertain LMP. ? ?On today's exam, echogenic bowel was noted. The causes of echogenic bowel including a normal variant, fetal aneuploidy, swallowed  blood, cystic fibrosis, and viral infections were discussed.  The association of bowel malrotation/atresia associated with echogenic bowel was also discussed. She denies any recent vaginal bleeding.  ? ?There were no signs of polydactyly noted today. ? ?A possible persistent right umbilical vein was also noted.  The patient was reassured that a persistent right umbilical vein is most likely a normal variant, however it has been associated with fetal aneuploidy and fetal growth restriction later in pregnancy.  ? ?Due to the echogenic bowel noted today and the possible persistent right umbilical vein, the patient was offered a blood test to screen for fetal aneuploidy.  She declined this test today.  She was also offered and declined to be screened for CMV and toxoplasmosis infections.  She will consider these blood tests during her future exams.   ? ?The patient was offered and declined an amniocentesis for definitive diagnosis of fetal aneuploidy.   ? ?The limitations of ultrasound in the detection of all anomalies was discussed today.  ? ?Due to the potential association of fetal growth restriction with echogenic bowel and the persistent  right umbilical pain, we will continue to follow her with growth ultrasounds throughout her pregnancy.  We will also assess for signs of bowel dilatation which may be a sign of gut malrotation or atresia later in her pregnancy. ? ?A follow up exam was scheduled in 4 weeks.  ? ?The patient stated that all of her questions had been answered. ? ?A total of 30 minutes was spent counseling and coordinating the care for this patient.  Greater than 50% of the time was spent in direct face-to-face contact. ?

## 2022-02-06 ENCOUNTER — Encounter: Payer: Medicaid Other | Admitting: Obstetrics

## 2022-02-28 ENCOUNTER — Ambulatory Visit: Payer: Medicaid Other | Attending: Obstetrics

## 2022-02-28 ENCOUNTER — Ambulatory Visit: Payer: Medicaid Other

## 2022-05-20 ENCOUNTER — Encounter (HOSPITAL_COMMUNITY): Payer: Medicaid Other

## 2022-05-30 ENCOUNTER — Other Ambulatory Visit (HOSPITAL_COMMUNITY)
Admission: RE | Admit: 2022-05-30 | Discharge: 2022-05-30 | Disposition: A | Discharge status: Home / Self Care | Payer: Medicaid Other | Source: Ambulatory Visit | Attending: Obstetrics and Gynecology | Admitting: Obstetrics and Gynecology

## 2022-05-30 ENCOUNTER — Encounter: Payer: Self-pay | Admitting: Obstetrics and Gynecology

## 2022-05-30 ENCOUNTER — Ambulatory Visit (INDEPENDENT_AMBULATORY_CARE_PROVIDER_SITE_OTHER): Payer: Medicaid Other | Admitting: Obstetrics and Gynecology

## 2022-05-30 VITALS — BP 113/78 | HR 97 | Wt 183.4 lb

## 2022-05-30 DIAGNOSIS — Z3480 Encounter for supervision of other normal pregnancy, unspecified trimester: Secondary | ICD-10-CM

## 2022-05-30 DIAGNOSIS — Z3483 Encounter for supervision of other normal pregnancy, third trimester: Secondary | ICD-10-CM

## 2022-05-30 DIAGNOSIS — Z3A37 37 weeks gestation of pregnancy: Secondary | ICD-10-CM

## 2022-05-30 DIAGNOSIS — Z3009 Encounter for other general counseling and advice on contraception: Secondary | ICD-10-CM | POA: Insufficient documentation

## 2022-05-30 NOTE — Progress Notes (Signed)
Subjective:  Allison Long is a 31 y.o. [phone] at [redacted]w[redacted]d being seen today for ongoing prenatal care.  She is currently monitored for the following issues for this low-risk pregnancy and has Supervision of other normal pregnancy, antepartum on their problem list.  Patient reports general discomforts of pregnancy.  Contractions: Irritability. Vag. Bleeding: None.  Movement: Present. Denies leaking of fluid.   The following portions of the patient's history were reviewed and updated as appropriate: allergies, current medications, past family history, past medical history, past social history, past surgical history and problem list. Problem list updated.  Objective:   Vitals:   05/30/22 1508  BP: 113/78  Pulse: 97  Weight: 183 lb 6.4 oz (83.2 kg)    Fetal Status:     Movement: Present     General:  Alert, oriented and cooperative. Patient is in no acute distress.  Skin: Skin is warm and dry. No rash noted.   Cardiovascular: Normal heart rate noted  Respiratory: Normal respiratory effort, no problems with respiration noted  Abdomen: Soft, gravid, appropriate for gestational age. Pain/Pressure: Absent     Pelvic:  Cervical exam performed        Extremities: Normal range of motion.  Edema: Trace  Mental Status: Normal mood and affect. Normal behavior. Normal judgment and thought content.   Urinalysis:      Assessment and Plan:  Pregnancy: [id number] at [redacted]w[redacted]d  1. Supervision of other normal pregnancy, antepartum Labor precautions  Random CBG, 1 hr , 106 - Culture, beta strep (group b only) - Cervicovaginal ancillary only( [place]) - CBC - HIV Antibody (routine testing w rflx) - RPR - Hemoglobin A1c - POCT Glucose (CBG)  2. BTL Papers signed today Pt aware BTL would be interval Term labor symptoms and general obstetric precautions including but not limited to vaginal bleeding, contractions, leaking of fluid and fetal movement were reviewed in detail with the patient. Please  refer to After Visit Summary for other counseling recommendations.  Return in about 1 week (around 06/06/2022) for OB visit, face to face, any provider.   Hermina Staggers, MD

## 2022-05-30 NOTE — Progress Notes (Signed)
Pt presents for ROB visit. Pt has not been seen in office since 3/31. Last U/S 4/24. Pt states she has moved and was unable to make appts. Pt interested in BTL. No other concerns at this time.

## 2022-05-30 NOTE — Patient Instructions (Signed)
Vaginal Delivery  Vaginal delivery means that you give birth by pushing your baby out of your birth canal (vagina). Your health care team will help you before, during, and after vaginal delivery. Birth experiences are unique for every woman and every pregnancy, and birth experiences vary depending on where you choose to give birth. What are the risks and benefits? Generally, this is safe. However, problems may occur, including: Bleeding. Infection. Damage to other structures such as vaginal tearing. Allergic reactions to medicines. Despite the risks, benefits of vaginal delivery include less risk of bleeding and infection and a shorter recovery time compared to a Cesarean delivery. Cesarean delivery, or C-section, is the surgical delivery of a baby. What happens when I arrive at the birth center or hospital? Once you are in labor and have been admitted into the hospital or birth center, your health care team may: Review your pregnancy history and any concerns that you have. Talk with you about your birth plan and discuss pain control options. Check your blood pressure, breathing, and heartbeat. Assess your baby's heartbeat. Monitor your uterus for contractions. Check whether your bag of water (amniotic sac) has broken (ruptured). Insert an IV into one of your veins. This may be used to give you fluids and medicines. Monitoring Your health care team may assess your contractions (uterine monitoring) and your baby's heart rate (fetal monitoring). You may need to be monitored: Often, but not continuously (intermittently). All the time or for long periods at a time (continuously). Continuous monitoring may be needed if: You are taking certain medicines, such as medicine to relieve pain or make your contractions stronger. You have pregnancy or labor complications. Monitoring may be done by: Placing a special stethoscope or a handheld monitoring device on your abdomen to check your baby's  heartbeat and to check for contractions. Placing monitors on your abdomen (external monitors) to record your baby's heartbeat and the frequency and length of contractions. Placing monitors inside your uterus through your vagina (internal monitors) to record your baby's heartbeat and the frequency, length, and strength of your contractions. Depending on the type of monitor, it may remain in your uterus or on your baby's head until birth. Telemetry. This is a type of continuous monitoring that can be done with external or internal monitors. Instead of having to stay in bed, you are able to move around. Physical exam Your health care team may perform frequent physical exams. This may include: Checking how and where your baby is positioned in your uterus. Checking your cervix to determine: Whether it is thinning out (effacing). Whether it is opening up (dilating). What happens during labor and delivery?  Normal labor and delivery is divided into the following three stages: Stage 1 This is the longest stage of labor. Throughout this stage, you will feel contractions. Contractions generally feel mild, infrequent, and irregular at first. They get stronger, more frequent, and more regular as you move through this stage. You may have contractions about every 2-3 minutes. This stage ends when your cervix is completely dilated to 4 inches (10 cm) and completely effaced. Stage 2 This stage starts once your cervix is completely effaced and dilated and lasts until the delivery of your baby. This is the stage where you will feel an urge to push your baby out of your vagina. You may feel stretching and burning pain, especially when the widest part of your baby's head passes through the vaginal opening (crowning). Once your baby is delivered, the umbilical cord will be   clamped and cut. Timing of cutting the cord will depend on your wishes, your baby's health, and your health care provider's practices. Your baby  will be placed on your bare chest (skin-to-skin contact) in an upright position and covered with a warm blanket. If you are choosing to breastfeed, watch your baby for feeding cues, like rooting or sucking, and help the baby to your breast for his or her first feeding. Stage 3 This stage starts immediately after the birth of your baby and ends after you deliver the placenta. This stage may take anywhere from 5 to 30 minutes. After your baby has been delivered, you will feel contractions as your body expels the placenta. These contractions also help your uterus get smaller and reduce bleeding. What can I expect after labor and delivery? After labor is over, you and your baby will be assessed closely until you are ready to go home. Your health care team will teach you how to care for yourself and your baby. You and your baby may be encouraged to stay in the same room (rooming in) during your hospital stay. This will help promote early bonding and successful breastfeeding. Your uterus will be checked and massaged regularly (fundal massage). You may continue to receive fluids and medicines through an IV. You will have some soreness and pain in your abdomen, vagina, and the area of skin between your vaginal opening and your anus (perineum). If an incision was made near your vagina (episiotomy) or if you had some vaginal tearing during delivery, cold compresses may be placed on your episiotomy or your tear. This helps to reduce pain and swelling. It is normal to have vaginal bleeding after delivery. Wear a sanitary pad for vaginal bleeding and discharge. Summary Vaginal delivery means that you will give birth by pushing your baby out of your birth canal (vagina). Your health care team will monitor you and your baby throughout the stages of labor. After you deliver your baby, your health care team will continue to assess you and your baby to ensure you are both recovering as expected after delivery. This  information is not intended to replace advice given to you by your health care provider. Make sure you discuss any questions you have with your health care provider. Document Revised: 08/29/2020 Document Reviewed: 08/29/2020 Elsevier Patient Education  2023 [employer]vier Inc.  

## 2022-05-31 LAB — HEMOGLOBIN A1C
Est. average glucose Bld gHb Est-mCnc: 103 mg/dL
Hgb A1c MFr Bld: 5.2 % (ref 4.8–5.6)

## 2022-05-31 LAB — CBC
Hematocrit: 32.7 % — ABNORMAL LOW (ref 34.0–46.6)
Hemoglobin: 11.6 g/dL (ref 11.1–15.9)
MCH: 30.1 pg (ref 26.6–33.0)
MCHC: 35.5 g/dL (ref 31.5–35.7)
MCV: 85 fL (ref 79–97)
Platelets: 303 10*3/uL (ref 150–450)
RBC: 3.85 x10E6/uL (ref 3.77–5.28)
RDW: 11.8 % (ref 11.7–15.4)
WBC: 7 10*3/uL (ref 3.4–10.8)

## 2022-05-31 LAB — CERVICOVAGINAL ANCILLARY ONLY
Bacterial Vaginitis (gardnerella): NEGATIVE
Candida Glabrata: NEGATIVE
Candida Vaginitis: POSITIVE — AB
Chlamydia: NEGATIVE
Comment: NEGATIVE
Comment: NEGATIVE
Comment: NEGATIVE
Comment: NEGATIVE
Comment: NEGATIVE
Comment: NORMAL
Neisseria Gonorrhea: NEGATIVE
Trichomonas: NEGATIVE

## 2022-05-31 LAB — RPR: RPR Ser Ql: NONREACTIVE

## 2022-05-31 LAB — HIV ANTIBODY (ROUTINE TESTING W REFLEX): HIV Screen 4th Generation wRfx: NONREACTIVE

## 2022-06-01 ENCOUNTER — Other Ambulatory Visit: Payer: Self-pay | Admitting: *Deleted

## 2022-06-01 DIAGNOSIS — B3731 Acute candidiasis of vulva and vagina: Secondary | ICD-10-CM

## 2022-06-01 MED ORDER — TERCONAZOLE 0.8 % VA CREA: 1.0000 | TOPICAL_CREAM | Freq: Every day | VAGINAL | 0 refills | Status: DC

## 2022-06-01 NOTE — Progress Notes (Signed)
Terconazole RX sent. TC to notify pt. Pt verbalized understanding.

## 2022-06-03 LAB — CULTURE, BETA STREP (GROUP B ONLY): Strep Gp B Culture: NEGATIVE

## 2022-06-06 ENCOUNTER — Encounter: Payer: Self-pay | Admitting: Student

## 2022-06-06 ENCOUNTER — Ambulatory Visit (INDEPENDENT_AMBULATORY_CARE_PROVIDER_SITE_OTHER): Payer: Medicaid Other | Admitting: Student

## 2022-06-06 VITALS — BP 110/70 | HR 100 | Wt 178.9 lb

## 2022-06-06 DIAGNOSIS — O0933 Supervision of pregnancy with insufficient antenatal care, third trimester: Secondary | ICD-10-CM

## 2022-06-06 DIAGNOSIS — Z3A39 39 weeks gestation of pregnancy: Secondary | ICD-10-CM

## 2022-06-06 DIAGNOSIS — Z3480 Encounter for supervision of other normal pregnancy, unspecified trimester: Secondary | ICD-10-CM

## 2022-06-06 DIAGNOSIS — Z3A38 38 weeks gestation of pregnancy: Secondary | ICD-10-CM

## 2022-06-08 NOTE — Progress Notes (Signed)
   PRENATAL VISIT NOTE  Subjective:  Allison Long is a 31 y.o. [phone] at [redacted]w[redacted]d being seen today for ongoing prenatal care.  She is currently monitored for the following issues for this low-risk pregnancy and has Supervision of other normal pregnancy, antepartum and Unwanted fertility on their problem list.  Patient reports no complaints.  Contractions: Irritability. Vag. Bleeding: None.  Movement: Present. Denies leaking of fluid.   The following portions of the patient's history were reviewed and updated as appropriate: allergies, current medications, past family history, past medical history, past social history, past surgical history and problem list.   Objective:   Vitals:   06/06/22 1449  BP: 110/70  Pulse: 100  Weight: 178 lb 14.4 oz (81.1 kg)    Fetal Status: Fetal Heart Rate (bpm): 138 Fundal Height: 38 cm Movement: Present     General:  Alert, oriented and cooperative. Patient is in no acute distress.  Skin: Skin is warm and dry. No rash noted.   Cardiovascular: Normal heart rate noted  Respiratory: Normal respiratory effort, no problems with respiration noted  Abdomen: Soft, gravid, appropriate for gestational age.  Pain/Pressure: Present     Pelvic: Cervical exam deferred        Extremities: Normal range of motion.  Edema: Trace  Mental Status: Normal mood and affect. Normal behavior. Normal judgment and thought content.   Assessment and Plan:  Pregnancy: [id number] at [redacted]w[redacted]d 1. Supervision of other normal pregnancy, antepartum -Feeling well, no complaints - Reports vigorous and frequent fetal movement - [place] MFM OB FOLLOW UP; Future  2. [redacted] weeks gestation of pregnancy - Patient expresses desire for natural onset of labor. Discussed importance of MFM follow-up next week. IOL requested for week 40-41  3. Limited prenatal care, third trimester - Follow-up US scheduled  Term labor symptoms and general obstetric precautions including but not limited to vaginal  bleeding, contractions, leaking of fluid and fetal movement were reviewed in detail with the patient. Please refer to After Visit Summary for other counseling recommendations.   No follow-ups on file.  Future Appointments  Date Time Provider Department Center  06/13/2022  1:50 PM Corlis Hove, NP [facility] None  06/14/2022 10:30 AM [facility] NURSE [facility]  06/14/2022 10:45 AM [facility] US5 [facility]  06/20/2022  6:30 AM [facility] SCHED ROOM [facility] None    Corlis Hove, NP

## 2022-06-13 ENCOUNTER — Inpatient Hospital Stay (HOSPITAL_COMMUNITY): Payer: Medicaid Other | Admitting: Anesthesiology

## 2022-06-13 ENCOUNTER — Other Ambulatory Visit: Payer: Self-pay

## 2022-06-13 ENCOUNTER — Encounter (HOSPITAL_COMMUNITY): Payer: Self-pay

## 2022-06-13 ENCOUNTER — Telehealth (HOSPITAL_COMMUNITY): Payer: Self-pay | Admitting: *Deleted

## 2022-06-13 ENCOUNTER — Encounter: Payer: Self-pay | Admitting: Student

## 2022-06-13 ENCOUNTER — Inpatient Hospital Stay (HOSPITAL_COMMUNITY)
Admission: AD | Admit: 2022-06-13 | Discharge: 2022-06-15 | DRG: 807 | Disposition: A | Discharge status: Home / Self Care | Payer: Medicaid Other | Attending: Obstetrics and Gynecology | Admitting: Obstetrics and Gynecology

## 2022-06-13 ENCOUNTER — Ambulatory Visit (INDEPENDENT_AMBULATORY_CARE_PROVIDER_SITE_OTHER): Payer: Medicaid Other | Admitting: Student

## 2022-06-13 ENCOUNTER — Encounter (HOSPITAL_COMMUNITY): Payer: Self-pay | Admitting: Obstetrics & Gynecology

## 2022-06-13 VITALS — BP 112/75 | HR 95 | Wt 180.4 lb

## 2022-06-13 DIAGNOSIS — Z3A39 39 weeks gestation of pregnancy: Secondary | ICD-10-CM

## 2022-06-13 DIAGNOSIS — Z3480 Encounter for supervision of other normal pregnancy, unspecified trimester: Secondary | ICD-10-CM

## 2022-06-13 DIAGNOSIS — O0933 Supervision of pregnancy with insufficient antenatal care, third trimester: Secondary | ICD-10-CM | POA: Diagnosis not present

## 2022-06-13 DIAGNOSIS — Z3483 Encounter for supervision of other normal pregnancy, third trimester: Secondary | ICD-10-CM

## 2022-06-13 DIAGNOSIS — O9902 Anemia complicating childbirth: Secondary | ICD-10-CM | POA: Diagnosis present

## 2022-06-13 DIAGNOSIS — Z87891 Personal history of nicotine dependence: Secondary | ICD-10-CM | POA: Diagnosis not present

## 2022-06-13 DIAGNOSIS — D649 Anemia, unspecified: Secondary | ICD-10-CM | POA: Diagnosis not present

## 2022-06-13 DIAGNOSIS — Z3A40 40 weeks gestation of pregnancy: Secondary | ICD-10-CM | POA: Diagnosis not present

## 2022-06-13 LAB — CBC
HCT: 33.6 % — ABNORMAL LOW (ref 36.0–46.0)
Hemoglobin: 11.2 g/dL — ABNORMAL LOW (ref 12.0–15.0)
MCH: 29.5 pg (ref 26.0–34.0)
MCHC: 33.3 g/dL (ref 30.0–36.0)
MCV: 88.4 fL (ref 80.0–100.0)
Platelets: 281 10*3/uL (ref 150–400)
RBC: 3.8 MIL/uL — ABNORMAL LOW (ref 3.87–5.11)
RDW: 12.9 % (ref 11.5–15.5)
WBC: 7.3 10*3/uL (ref 4.0–10.5)
nRBC: 0 % (ref 0.0–0.2)

## 2022-06-13 LAB — TYPE AND SCREEN
ABO/RH(D): O POS
Antibody Screen: NEGATIVE

## 2022-06-13 MED ORDER — LACTATED RINGERS IV SOLN: INTRAVENOUS | Status: DC

## 2022-06-13 MED ORDER — LACTATED RINGERS IV SOLN: 500.0000 mL | Freq: Once | INTRAVENOUS | Status: DC

## 2022-06-13 MED ORDER — EPHEDRINE 5 MG/ML INJ: 10.0000 mg | INTRAVENOUS | Status: DC | PRN

## 2022-06-13 MED ORDER — ACETAMINOPHEN 325 MG PO TABS
650.0000 mg | ORAL_TABLET | ORAL | Status: DC | PRN
Start: 2022-06-13 — End: 2022-06-14

## 2022-06-13 MED ORDER — SOD CITRATE-CITRIC ACID 500-334 MG/5ML PO SOLN
30.0000 mL | ORAL | Status: DC | PRN
  Administered 2022-06-14: 30 mL via ORAL
  Filled 2022-06-13: qty 30

## 2022-06-13 MED ORDER — OXYTOCIN-SODIUM CHLORIDE 30-0.9 UT/500ML-% IV SOLN
2.5000 [IU]/h | INTRAVENOUS | Status: DC
  Filled 2022-06-13: qty 500

## 2022-06-13 MED ORDER — DIPHENHYDRAMINE HCL 50 MG/ML IJ SOLN
12.5000 mg | INTRAMUSCULAR | Status: DC | PRN
  Administered 2022-06-14: 12.5 mg via INTRAVENOUS
  Filled 2022-06-13: qty 1

## 2022-06-13 MED ORDER — TERBUTALINE SULFATE 1 MG/ML IJ SOLN
0.2500 mg | Freq: Once | INTRAMUSCULAR | Status: DC | PRN
Start: 2022-06-13 — End: 2022-06-14

## 2022-06-13 MED ORDER — LACTATED RINGERS IV SOLN: 500.0000 mL | INTRAVENOUS | Status: DC | PRN

## 2022-06-13 MED ORDER — OXYCODONE-ACETAMINOPHEN 5-325 MG PO TABS: 2.0000 | ORAL_TABLET | ORAL | Status: DC | PRN

## 2022-06-13 MED ORDER — PHENYLEPHRINE 80 MCG/ML (10ML) SYRINGE FOR IV PUSH (FOR BLOOD PRESSURE SUPPORT)
80.0000 ug | PREFILLED_SYRINGE | INTRAVENOUS | Status: DC | PRN
Start: 2022-06-13 — End: 2022-06-14

## 2022-06-13 MED ORDER — LIDOCAINE HCL (PF) 1 % IJ SOLN: 30.0000 mL | INTRAMUSCULAR | Status: DC | PRN

## 2022-06-13 MED ORDER — MISOPROSTOL 25 MCG QUARTER TABLET
25.0000 ug | ORAL_TABLET | Freq: Once | ORAL | Status: AC
  Administered 2022-06-13: 25 ug via VAGINAL
  Filled 2022-06-13: qty 1

## 2022-06-13 MED ORDER — ONDANSETRON HCL 4 MG/2ML IJ SOLN
4.0000 mg | Freq: Four times a day (QID) | INTRAMUSCULAR | Status: DC | PRN
  Administered 2022-06-14: 4 mg via INTRAVENOUS
  Filled 2022-06-13: qty 2

## 2022-06-13 MED ORDER — FENTANYL-BUPIVACAINE-NACL 0.5-0.125-0.9 MG/250ML-% EP SOLN
12.0000 mL/h | EPIDURAL | Status: DC | PRN
  Administered 2022-06-13: 12 mL/h via EPIDURAL
  Filled 2022-06-13: qty 250

## 2022-06-13 MED ORDER — OXYCODONE-ACETAMINOPHEN 5-325 MG PO TABS: 1.0000 | ORAL_TABLET | ORAL | Status: DC | PRN

## 2022-06-13 MED ORDER — LIDOCAINE HCL (PF) 1 % IJ SOLN
INTRAMUSCULAR | Status: DC | PRN
  Administered 2022-06-13: 10 mL via EPIDURAL

## 2022-06-13 MED ORDER — OXYTOCIN BOLUS FROM INFUSION
333.0000 mL | Freq: Once | INTRAVENOUS | Status: AC
  Administered 2022-06-14: 333 mL via INTRAVENOUS

## 2022-06-13 MED ORDER — MISOPROSTOL 50MCG HALF TABLET
50.0000 ug | ORAL_TABLET | Freq: Once | ORAL | Status: AC
  Administered 2022-06-13: 50 ug via ORAL
  Filled 2022-06-13: qty 1

## 2022-06-13 MED ORDER — EPHEDRINE 5 MG/ML INJ
10.0000 mg | INTRAVENOUS | Status: DC | PRN
Start: 2022-06-13 — End: 2022-06-14

## 2022-06-13 NOTE — Anesthesia Procedure Notes (Signed)
Epidural Patient location during procedure: OB Start time: 06/13/2022 10:56 PM End time: 06/13/2022 10:59 PM  Staffing Anesthesiologist: Leilani Able, MD Performed: anesthesiologist   Preanesthetic Checklist Completed: patient identified, IV checked, site marked, risks and benefits discussed, surgical consent, monitors and equipment checked, pre-op evaluation and timeout performed  Epidural Patient position: sitting Prep: DuraPrep and site prepped and draped Patient monitoring: continuous pulse ox and blood pressure Approach: midline Location: L3-L4 Injection technique: LOR air  Needle:  Needle type: Tuohy  Needle gauge: 17 G Needle length: 9 cm and 9 Needle insertion depth: 7 cm Catheter type: closed end flexible Catheter size: 19 Gauge Catheter at skin depth: 12 cm Test dose: negative and Other  Assessment Events: blood not aspirated, injection not painful, no injection resistance, no paresthesia and negative IV test  Additional Notes Reason for block:procedure for pain

## 2022-06-13 NOTE — Telephone Encounter (Signed)
Preadmission screen  

## 2022-06-13 NOTE — Progress Notes (Signed)
G6P4 @39 .6 wks sent from office for NRNST. Current NST also non-reactive, recommend admission and IOL. Pt agrees with plan. Labor team notified.

## 2022-06-13 NOTE — Progress Notes (Signed)
   PRENATAL VISIT NOTE  Subjective:  Allison Long is a 31 y.o. [phone] at [redacted]w[redacted]d being seen today for ongoing prenatal care.  She is currently monitored for the following issues for this high-risk pregnancy and has Supervision of other normal pregnancy, antepartum and Unwanted fertility on their problem list.  Patient reports  pelvic pressure .  Contractions: Not present. Vag. Bleeding: None.  Movement: Present. Denies leaking of fluid.   The following portions of the patient's history were reviewed and updated as appropriate: allergies, current medications, past family history, past medical history, past social history, past surgical history and problem list.   Objective:   Vitals:   06/13/22 1401  BP: 112/75  Pulse: 95  Weight: 180 lb 6.4 oz (81.8 kg)    Fetal Status: Fetal Heart Rate (bpm): 116   Movement: Present     General:  Alert, oriented and cooperative. Patient is in no acute distress.  Skin: Skin is warm and dry. No rash noted.   Cardiovascular: Normal heart rate noted  Respiratory: Normal respiratory effort, no problems with respiration noted  Abdomen: Soft, gravid, appropriate for gestational age.  Pain/Pressure: Present     Pelvic: Cervical exam deferred        Extremities: Normal range of motion.  Edema: None  Mental Status: Normal mood and affect. Normal behavior. Normal judgment and thought content.   Assessment and Plan:  Pregnancy: [id number] at [redacted]w[redacted]d 1. Supervision of other normal pregnancy, antepartum - FHT 116 by doppler -NST nonreassuring. Baseline 120, Acceleration x1. Dr. Alysia Penna notified and consulted on fetal monitoring. Due to GA and limited prenatal care recommended patient be assessed and managed at MAU. Unable to complete BPP in office.   2. [redacted] weeks gestation of pregnancy - IOL scheduled next week, if needed  3. Limited prenatal care, third trimester - Echogenic bowel noted on anatomy scan on 02/05/22, repeat US not completed during gap in  care  Preterm labor symptoms and general obstetric precautions including but not limited to vaginal bleeding, contractions, leaking of fluid and fetal movement were reviewed in detail with the patient. Please refer to After Visit Summary for other counseling recommendations.   No follow-ups on file.  Future Appointments  Date Time Provider Department Center  06/14/2022 10:30 AM [facility] NURSE [facility]  06/14/2022 10:45 AM [facility] US5 [facility]  06/20/2022  6:30 AM [facility] SCHED ROOM [facility] None    Corlis Hove, NP

## 2022-06-13 NOTE — Progress Notes (Signed)
Pt presents for ROB visit. Pt requesting cervical check. Inductions scheduled for 9/6

## 2022-06-13 NOTE — H&P (Signed)
OBSTETRIC ADMISSION HISTORY AND PHYSICAL  Allison Long is a 31 y.o. female [phone] with IUP at [redacted]w[redacted]d by 21 wk u/s presenting for IOL for non-reactive NST. She reports +FMs, No LOF, no VB, no blurry vision, headaches or peripheral edema, and RUQ pain.  She plans on breast and formula feeding. She request depo followed by interval BTL for birth control. She received her prenatal care at  [facility]    Dating: By 21 wk u/s --->  Estimated Date of Delivery: 06/14/22  Sono:    @[redacted]w[redacted]d , CWD, echogenic bowel, breech presentation, anterior placenta, 462g, 64% EFW   Prenatal History/Complications: limited prenatal care   Past Medical History: Past Medical History:  Diagnosis Date   Anemia    Chlamydia    x 2    Past Surgical History: Past Surgical History:  Procedure Laterality Date   NO PAST SURGERIES      Obstetrical History: OB History     Gravida  6   Para  4   Term  4   Preterm  0   AB  1   Living  4      SAB  1   IAB  0   Ectopic  0   Multiple  0   Live Births  4           Social History Social History   Socioeconomic History   Marital status: Single    Spouse name: Not on file   Number of children: Not on file   Years of education: Not on file   Highest education level: Not on file  Occupational History   Not on file  Tobacco Use   Smoking status: Former    Packs/day: 0.50    Types: Cigarettes   Smokeless tobacco: Never   Tobacco comments:    not during pregnancy  Vaping Use   Vaping Use: Never used  Substance and Sexual Activity   Alcohol use: Not Currently    Comment: occasional   Drug use: Not Currently    Frequency: 7.0 times per week    Types: Marijuana    Comment: none during pregnancy   Sexual activity: Yes    Birth control/protection: None  Other Topics Concern   Not on file  Social History Narrative   Not on file   Social Determinants of Health   Financial Resource Strain: Not on file  Food Insecurity: Not on file   Transportation Needs: Not on file  Physical Activity: Not on file  Stress: Not on file  Social Connections: Not on file    Family History: Family History  Problem Relation Age of Onset   Thyroid disease Mother    Cancer Father        colon    Allergies: No Known Allergies  Medications Prior to Admission  Medication Sig Dispense Refill Last Dose   Prenatal Vit-Fe Fumarate-FA (MULTIVITAMIN-PRENATAL) 27-0.8 MG TABS tablet Take 1 tablet by mouth daily at 12 noon.   06/12/2022   terconazole (TERAZOL 3) 0.8 % vaginal cream Place 1 applicator vaginally at bedtime. Apply nightly for three nights. (Patient not taking: Reported on 06/13/2022) 20 g 0      Review of Systems   All systems reviewed and negative except as stated in HPI  Blood pressure 122/76, pulse 92, temperature 98 F (36.7 C), temperature source Oral, resp. rate 18, height 5' (1.524 m), weight 82.4 kg, last menstrual period 08/31/2021, SpO2 99 %. General appearance: alert, cooperative, and appears stated  age Lungs: normal work of breathing  Heart: regular rate  Abdomen: soft, non-tender; bowel sounds normal Extremities: Homans sign is negative, no sign of DVT Presentation: cephalic Fetal monitoringBaseline: 120 bpm, Variability: Good {> 6 bpm), Accelerations: Reactive, and Decelerations: Absent Uterine activityFrequency: Every 12 minutes     Prenatal labs: ABO, Rh: --/--/PENDING (08/30 1655) Antibody: PENDING (08/30 1655) Rubella: 19.20 (03/21 1539) RPR: Non Reactive (08/16 1535)  HBsAg: Negative (03/21 1539)  HIV: Non Reactive (08/16 1535)  GBS: Negative/-- (08/16 1540)  1 hr Glucola normal  Genetic screening  declined  Anatomy US echogenic bowel and persistent right umbilical vein   Prenatal Transfer Tool  Maternal Diabetes: No Genetic Screening: Declined Maternal Ultrasounds/Referrals: Echogenic bowel Fetal Ultrasounds or other Referrals:  Referred to Materal Fetal Medicine  Maternal Substance Abuse:   No Significant Maternal Medications:  None Significant Maternal Lab Results:  Group B Strep negative Number of Prenatal Visits:greater than 3 verified prenatal visits Other Comments:  None  Results for orders placed or performed during the hospital encounter of 06/13/22 (from the past 24 hour(s))  Type and screen [facility]   Collection Time: 06/13/22  4:55 PM  Result Value Ref Range   ABO/RH(D) PENDING    Antibody Screen PENDING    Sample Expiration      06/16/2022,2359 Performed at [facility], [address], [place], [place] [address]     Patient Active Problem List   Diagnosis Date Noted   Indication for care in labor or delivery 06/13/2022   Unwanted fertility 05/30/2022   Supervision of other normal pregnancy, antepartum 01/02/2022    Assessment/Plan:  Allison Long is a 31 y.o. [id number] at [redacted]w[redacted]d here for IOL for nonreactive NST.    #Labor:Initiating induction with cytotec. Cervix fingertip thick and high  #Pain: PRN epidural  #FWB: Cat 1  #ID:  GBS neg #MOF: both #MOC:depo > interval tubal  #Circ:  Yes   Allison Savage, MD  06/13/2022, 5:50 PM

## 2022-06-13 NOTE — Progress Notes (Signed)
Labor Progress Note MANIAH NADING is a 31 y.o. [id number] at [redacted]w[redacted]d presented for IOL due to NRST.  S: Tangi is doing well. Feeling contractions.   O:  BP (!) 107/56   Pulse 81   Temp 98.1 F (36.7 C) (Oral)   Resp 16   Ht 5' (1.524 m)   Wt 82.4 kg   LMP 08/31/2021 (Exact Date)   SpO2 99%   BMI 35.47 kg/m  EFM: 140/moderate variability/(+) acels, intermittent lates  CVE: Dilation: Fingertip Effacement (%): Thick Cervical Position: Posterior Presentation: Vertex Exam by:: Dr Nobie Putnam   A&P: 31 y.o. [id number] [redacted]w[redacted]d admitted for IOL due to NRST. #Labor: Progressing well. S/P one dose of cytotec at 1831.  #Pain: Family support. Will eventually want epidural.  #FWB: Cat II #GBS negative  Amila Callies Burgess Estelle, MD Resident Physician 9:55 PM

## 2022-06-13 NOTE — Anesthesia Preprocedure Evaluation (Signed)
Anesthesia Evaluation  Patient identified by MRN, date of birth, ID band Patient awake    Reviewed: Allergy & Precautions, H&P , NPO status , Patient's Chart, lab work & pertinent test results  Airway Mallampati: I       Dental no notable dental hx.    Pulmonary neg pulmonary ROS, Current Smoker, former smoker,    Pulmonary exam normal        Cardiovascular negative cardio ROS Normal cardiovascular exam     Neuro/Psych negative neurological ROS  negative psych ROS   GI/Hepatic negative GI ROS, Neg liver ROS,   Endo/Other  negative endocrine ROS  Renal/GU negative Renal ROS     Musculoskeletal   Abdominal (+) + obese,   Peds  Hematology  (+) Blood dyscrasia, anemia ,   Anesthesia Other Findings   Reproductive/Obstetrics (+) Pregnancy                             Anesthesia Physical  Anesthesia Plan  ASA: II  Anesthesia Plan: Epidural   Post-op Pain Management:    Induction:   PONV Risk Score and Plan:   Airway Management Planned:   Additional Equipment:   Intra-op Plan:   Post-operative Plan:   Informed Consent: I have reviewed the patients History and Physical, chart, labs and discussed the procedure including the risks, benefits and alternatives for the proposed anesthesia with the patient or authorized representative who has indicated his/her understanding and acceptance.       Plan Discussed with:   Anesthesia Plan Comments:         Anesthesia Quick Evaluation

## 2022-06-13 NOTE — MAU Note (Signed)
Allison Long is a 31 y.o. at [redacted]w[redacted]d here in MAU reporting: sent from [facility] office for fetal monitoring.  States monitor was applies @ office because heart rate was too low, then told heart rate was too high.  Pt states unsure what's going on.  Denies VB or LOF.  Endorses +FM. LMP: N/A Onset of complaint: today Pain score: 0 There were no vitals filed for this visit.   FHT:122 bpm Lab orders placed from triage:  None

## 2022-06-14 ENCOUNTER — Encounter (HOSPITAL_COMMUNITY): Payer: Self-pay | Admitting: Family Medicine

## 2022-06-14 ENCOUNTER — Ambulatory Visit: Payer: Medicaid Other

## 2022-06-14 DIAGNOSIS — Z3A40 40 weeks gestation of pregnancy: Secondary | ICD-10-CM

## 2022-06-14 LAB — RPR: RPR Ser Ql: NONREACTIVE

## 2022-06-14 MED ORDER — TERBUTALINE SULFATE 1 MG/ML IJ SOLN: 0.2500 mg | Freq: Once | INTRAMUSCULAR | Status: DC | PRN

## 2022-06-14 MED ORDER — COCONUT OIL OIL: 1.0000 | TOPICAL_OIL | Status: DC | PRN

## 2022-06-14 MED ORDER — DIBUCAINE (PERIANAL) 1 % EX OINT: 1.0000 | TOPICAL_OINTMENT | CUTANEOUS | Status: DC | PRN

## 2022-06-14 MED ORDER — PRENATAL MULTIVITAMIN CH
1.0000 | ORAL_TABLET | Freq: Every day | ORAL | Status: DC
  Administered 2022-06-15: 1 via ORAL
  Filled 2022-06-14: qty 1

## 2022-06-14 MED ORDER — TRANEXAMIC ACID-NACL 1000-0.7 MG/100ML-% IV SOLN
1000.0000 mg | INTRAVENOUS | Status: AC
  Administered 2022-06-14: 1000 mg via INTRAVENOUS
  Filled 2022-06-14: qty 100

## 2022-06-14 MED ORDER — MEDROXYPROGESTERONE ACETATE 150 MG/ML IM SUSP
150.0000 mg | Freq: Once | INTRAMUSCULAR | Status: AC
  Administered 2022-06-15: 150 mg via INTRAMUSCULAR
  Filled 2022-06-14: qty 1

## 2022-06-14 MED ORDER — SENNOSIDES-DOCUSATE SODIUM 8.6-50 MG PO TABS
2.0000 | ORAL_TABLET | ORAL | Status: DC
  Administered 2022-06-14 – 2022-06-15 (×2): 2 via ORAL
  Filled 2022-06-14 (×2): qty 2

## 2022-06-14 MED ORDER — ONDANSETRON HCL 4 MG/2ML IJ SOLN: 4.0000 mg | INTRAMUSCULAR | Status: DC | PRN

## 2022-06-14 MED ORDER — METHYLERGONOVINE MALEATE 0.2 MG/ML IJ SOLN
INTRAMUSCULAR | Status: AC
  Administered 2022-06-14: 0.2 mg
  Filled 2022-06-14: qty 1

## 2022-06-14 MED ORDER — ONDANSETRON HCL 4 MG PO TABS: 4.0000 mg | ORAL_TABLET | ORAL | Status: DC | PRN

## 2022-06-14 MED ORDER — LACTATED RINGERS IV BOLUS
500.0000 mL | Freq: Once | INTRAVENOUS | Status: AC
  Administered 2022-06-14: 500 mL via INTRAVENOUS

## 2022-06-14 MED ORDER — OXYTOCIN-SODIUM CHLORIDE 30-0.9 UT/500ML-% IV SOLN
1.0000 m[IU]/min | INTRAVENOUS | Status: DC
  Administered 2022-06-14: 2 m[IU]/min via INTRAVENOUS

## 2022-06-14 MED ORDER — WITCH HAZEL-GLYCERIN EX PADS: 1.0000 | MEDICATED_PAD | CUTANEOUS | Status: DC | PRN

## 2022-06-14 MED ORDER — SIMETHICONE 80 MG PO CHEW: 80.0000 mg | CHEWABLE_TABLET | ORAL | Status: DC | PRN

## 2022-06-14 MED ORDER — BENZOCAINE-MENTHOL 20-0.5 % EX AERO
1.0000 | INHALATION_SPRAY | CUTANEOUS | Status: DC | PRN
  Administered 2022-06-14: 1 via TOPICAL
  Filled 2022-06-14: qty 56

## 2022-06-14 MED ORDER — METHYLERGONOVINE MALEATE 0.2 MG/ML IJ SOLN: 0.2000 mg | Freq: Once | INTRAMUSCULAR | Status: DC

## 2022-06-14 MED ORDER — TETANUS-DIPHTH-ACELL PERTUSSIS 5-2.5-18.5 LF-MCG/0.5 IM SUSY: 0.5000 mL | PREFILLED_SYRINGE | Freq: Once | INTRAMUSCULAR | Status: DC

## 2022-06-14 MED ORDER — MEASLES, MUMPS & RUBELLA VAC IJ SOLR: 0.5000 mL | Freq: Once | INTRAMUSCULAR | Status: DC

## 2022-06-14 MED ORDER — DIPHENHYDRAMINE HCL 25 MG PO CAPS: 25.0000 mg | ORAL_CAPSULE | Freq: Four times a day (QID) | ORAL | Status: DC | PRN

## 2022-06-14 MED ORDER — IBUPROFEN 600 MG PO TABS
600.0000 mg | ORAL_TABLET | Freq: Four times a day (QID) | ORAL | Status: DC
  Administered 2022-06-14 – 2022-06-15 (×4): 600 mg via ORAL
  Filled 2022-06-14 (×4): qty 1

## 2022-06-14 MED ORDER — ACETAMINOPHEN 325 MG PO TABS
650.0000 mg | ORAL_TABLET | ORAL | Status: DC | PRN
  Administered 2022-06-14: 650 mg via ORAL
  Filled 2022-06-14: qty 2

## 2022-06-14 NOTE — Lactation Note (Signed)
This note was copied from a baby's chart. Lactation Consultation Note  Patient Name: Allison Long [id number] Date: 06/14/2022 Reason for consult: Initial assessment;Mother's request;Difficult latch;Term;Breastfeeding assistance Age:30 hours  Birth parent struggling with latch had some pain. Compression stripe noted, but skin intact.  LC assisted getting a deeper latch with signs of milk transfer.  Plan 1. To feed based on cues 8-12x 24hr period. Birth parent to work on breasts and look for signs of milk transfer. 2. If unable to latch, birth parent to offer colostrum on a spoon then try a latch.   All questions answered at the end of the visit.   Maternal Data Has patient been taught Hand Expression?: Yes Does the patient have breastfeeding experience prior to this delivery?: Yes How long did the patient breastfeed?: attempted but not successful, stopped after 1-2 days  Feeding Mother's Current Feeding Choice: Breast Milk  LATCH Score Latch: Repeated attempts needed to sustain latch, nipple held in mouth throughout feeding, stimulation needed to elicit sucking reflex.  Audible Swallowing: Spontaneous and intermittent  Type of Nipple: Everted at rest and after stimulation  Comfort (Breast/Nipple): Soft / non-tender  Hold (Positioning): Assistance needed to correctly position infant at breast and maintain latch.  LATCH Score: 8   Lactation Tools Discussed/Used    Interventions Interventions: Breast feeding basics reviewed;Assisted with latch;Skin to skin;Breast massage;Hand express;Breast compression;Adjust position;Support pillows;Position options;Expressed milk;Education;LC Psychologist, educational;Visual merchandiser education  Discharge WIC Program: No  Consult Status Consult Status: Follow-up Date: 06/15/22 Follow-up type: In-patient    Asusena Sigley  Nicholson-Springer 06/14/2022, 7:03 PM

## 2022-06-14 NOTE — Anesthesia Postprocedure Evaluation (Signed)
Anesthesia Post Note  Patient: Allison Long  Procedure(s) Performed: AN AD HOC LABOR EPIDURAL     Patient location during evaluation: Mother Baby Anesthesia Type: Epidural Level of consciousness: awake and alert Pain management: pain level controlled Vital Signs Assessment: post-procedure vital signs reviewed and stable Respiratory status: spontaneous breathing, nonlabored ventilation and respiratory function stable Cardiovascular status: stable Postop Assessment: no headache, no backache and epidural receding Anesthetic complications: no   No notable events documented.  Last Vitals:  Vitals:   06/14/22 1340 06/14/22 1440  BP: 117/77 114/69  Pulse: 89 72  Resp: 16 16  Temp: 36.7 C 36.6 C  SpO2: 100% 100%    Last Pain:  Vitals:   06/14/22 1440  TempSrc: Oral  PainSc:    Pain Goal:                   [name]

## 2022-06-14 NOTE — Discharge Summary (Signed)
Postpartum Discharge Summary  Date of Service updated 06/15/22    Patient Name: Allison Long DOB: 1991-01-10 MRN: [id number]  Date of admission: 06/13/2022 Delivery date:06/14/2022  Delivering provider: Julianne Handler  Date of discharge: 06/15/2022  Admitting diagnosis: Indication for care in labor or delivery [O75.9] Intrauterine pregnancy: [redacted]w[redacted]d     Secondary diagnosis:  Principal Problem:   Indication for care in labor or delivery Active Problems:   SVD (spontaneous vaginal delivery)   Shoulder dystocia during labor and delivery  Additional problems: limited PNC, anemia, Chlamydia   Discharge diagnosis: Term Pregnancy Delivered                                              Post partum procedures: none Augmentation: AROM, Pitocin, and Cytotec Complications: None  Hospital course: Induction of Labor With Vaginal Delivery   31 y.o. yo [id number] at [redacted]w[redacted]d was admitted to the hospital 06/13/2022 for induction of labor.  Indication for induction:  NRNST .  Patient had an uncomplicated labor course as follows: Membrane Rupture Time/Date: 5:15 AM ,06/14/2022   Delivery Method:Vaginal, Spontaneous  Episiotomy: None  Lacerations:  None  Details of delivery can be found in separate delivery note.  Patient had a routine postpartum course. Patient is discharged home 06/15/22.  Newborn Data: Birth date:06/14/2022  Birth time:11:19 AM  Gender:Female  Living status:Living  Apgars:8 ,9  Weight:3380 g   Magnesium Sulfate received: No BMZ received: No Rhophylac:N/A MMR:N/A T-DaP: no Flu: N/A Transfusion:No  Physical exam  Vitals:   06/14/22 1610 06/14/22 1814 06/14/22 2210 06/15/22 0300  BP:  112/63 102/69 100/71  Pulse:  83 89 81  Resp:  $Remo'16 18 17  'lDwJX$ Temp:  97.7 F (36.5 C) 97.7 F (36.5 C) 97.6 F (36.4 C)  TempSrc:  Oral Oral Oral  SpO2: 98%  99% 99%  Weight:      Height:       General: alert, cooperative, and no distress Lochia: appropriate Uterine Fundus:  firm Incision: N/A DVT Evaluation: No evidence of DVT seen on physical exam. Labs: Lab Results  Component Value Date   WBC 7.3 06/13/2022   HGB 11.2 (L) 06/13/2022   HCT 33.6 (L) 06/13/2022   MCV 88.4 06/13/2022   PLT 281 06/13/2022      Latest Ref Rng & Units 09/06/2016    6:40 AM  CMP  Glucose 65 - 99 mg/dL 75   BUN 6 - 20 mg/dL 9   Creatinine 0.44 - 1.00 mg/dL 0.57   Sodium 135 - 145 mmol/L 134   Potassium 3.5 - 5.1 mmol/L 4.0   Chloride 101 - 111 mmol/L 107   CO2 22 - 32 mmol/L 20   Calcium 8.9 - 10.3 mg/dL 8.9   Total Protein 6.5 - 8.1 g/dL 6.5   Total Bilirubin 0.3 - 1.2 mg/dL 0.4   Alkaline Phos 38 - 126 U/L 198   AST 15 - 41 U/L 18   ALT 14 - 54 U/L 11    Edinburgh Score:    06/14/2022   10:10 PM  Edinburgh Postnatal Depression Scale Screening Tool  I have been able to laugh and see the funny side of things. 0  I have looked forward with enjoyment to things. 0  I have blamed myself unnecessarily when things went wrong. 1  I have been anxious or worried for no  good reason. 1  I have felt scared or panicky for no good reason. 1  Things have been getting on top of me. 1  I have been so unhappy that I have had difficulty sleeping. 1  I have felt sad or miserable. 1  I have been so unhappy that I have been crying. 1  The thought of harming myself has occurred to me. 0  Edinburgh Postnatal Depression Scale Total 7     After visit meds:  Allergies as of 06/15/2022   No Known Allergies      Medication List     TAKE these medications    acetaminophen 325 MG tablet Commonly known as: Tylenol Take 2 tablets (650 mg total) by mouth every 6 (six) hours as needed (for pain scale < 4).   benzocaine-Menthol 20-0.5 % Aero Commonly known as: DERMOPLAST Apply 1 Application topically as needed for irritation (perineal discomfort).   calcium carbonate 500 MG chewable tablet Commonly known as: TUMS - dosed in mg elemental calcium Chew 500 mg by mouth daily as  needed for indigestion or heartburn.   ibuprofen 600 MG tablet Commonly known as: ADVIL Take 1 tablet (600 mg total) by mouth every 6 (six) hours.   multivitamin-prenatal 27-0.8 MG Tabs tablet Take 1 tablet by mouth daily at 12 noon.   senna-docusate 8.6-50 MG tablet Commonly known as: Senokot-S Take 2 tablets by mouth at bedtime as needed for mild constipation.         Discharge home in stable condition Infant Feeding: Bottle and Breast Infant Disposition:home with mother Discharge instruction: per After Visit Summary and Postpartum booklet. Activity: Advance as tolerated. Pelvic rest for 6 weeks.  Diet: routine diet Future Appointments: Future Appointments  Date Time Provider [place]  07/16/2022  1:50 PM Constant, Vickii Chafe, MD [place] None   Follow up Visit:   Please schedule this patient for a In person postpartum visit in 6 weeks with the following provider: MD. Additional Postpartum F/U: pre-op for interval BTL   Low risk pregnancy complicated by:  none Delivery mode:  Vaginal, Spontaneous  Anticipated Birth Control:  Plans Interval BTL   06/15/2022 Shelda Pal, DO

## 2022-06-14 NOTE — Progress Notes (Signed)
Labor Progress Note Allison Long is a 31 y.o. [id number] at [redacted]w[redacted]d presented for IOL due to NRST.  S: Epidural has been placed. Resting comfortably.   O:  BP (!) 94/51   Pulse 96   Temp 98.1 F (36.7 C) (Oral)   Resp 16   Ht 5' (1.524 m)   Wt 82.4 kg   LMP 08/31/2021 (Exact Date)   SpO2 99%   BMI 35.47 kg/m  EFM: 140/moderate variability/(+) acels, variable decelerations  CVE: Dilation: 3 Effacement (%): 50 Cervical Position: Posterior Station: -1 Presentation: Vertex Exam by:: Wynelle Bourgeois CNM   A&P: 31 y.o. [id number] [redacted]w[redacted]d admitted for IOL due to NRST. #Labor: Progressing well. Will start pitocin 2x2.  #Pain: Epidural in place #FWB: Cat II #GBS negative  Daily [name], MD Resident Physician 12:08 AM

## 2022-06-14 NOTE — Lactation Note (Signed)
This note was copied from a baby's chart. Lactation Consultation Note  Patient Name: Boy Marcelene Weidemann KYHCW'C Date: 06/14/2022 Reason for consult: L&D Initial assessment;Term Age:30 hours   Initial L&D Consult:  Arrived to find baby latched and sucking eagerly.  RN assisted with latching; birth parent denied discomfort.  Reassured birth parent that lactation services will be available on the M/B unit.  Allowed time for family bonding.   Maternal Data    Feeding Mother's Current Feeding Choice: Breast Milk  LATCH Score Latch: Grasps breast easily, tongue down, lips flanged, rhythmical sucking.  Audible Swallowing: None  Type of Nipple:  (Baby latched when i arrived)  Comfort (Breast/Nipple): Soft / non-tender  Hold (Positioning): Assistance needed to correctly position infant at breast and maintain latch. (RN assisted with latching)  LATCH Score: 6   Lactation Tools Discussed/Used    Interventions Interventions: Skin to skin  Discharge    Consult Status Consult Status: Follow-up from L&D    Tristen Pennino R Inola Lisle 06/14/2022, 12:40 PM

## 2022-06-14 NOTE — Progress Notes (Signed)
Labor Progress Note Allison Long is a 31 y.o. [id number] at [redacted]w[redacted]d presented for IOL due to NRST.  S: [name] is doing well. Resting comfortably.   O:  BP 95/60   Pulse 75   Temp 97.9 F (36.6 C) (Oral)   Resp 16   Ht 5' (1.524 m)   Wt 82.4 kg   LMP 08/31/2021 (Exact Date)   SpO2 99%   BMI 35.47 kg/m  EFM: 105/moderate variability/(+) acels, variable decelerations  CVE: Dilation: 7.5 Effacement (%): 50 Cervical Position: Posterior Station: -3 Presentation: Vertex Exam by:: Makaleigh Reinard   A&P: 31 y.o. [id number] [redacted]w[redacted]d admitted for IOL due to NRST. #Labor: Progressing well. AROM at 0515 #Pain: Epidural in place #FWB: Cat I with low baseline.  #GBS negative  Mattalynn Crandle Burgess Estelle, MD Resident Physician 5:18 AM

## 2022-06-15 ENCOUNTER — Other Ambulatory Visit (HOSPITAL_COMMUNITY): Payer: Self-pay

## 2022-06-15 MED ORDER — ACETAMINOPHEN 325 MG PO TABS: 650.0000 mg | ORAL_TABLET | Freq: Four times a day (QID) | ORAL | Status: DC | PRN

## 2022-06-15 MED ORDER — IBUPROFEN 600 MG PO TABS: 600.0000 mg | ORAL_TABLET | Freq: Four times a day (QID) | ORAL | 0 refills | Status: DC

## 2022-06-15 MED ORDER — SENNOSIDES-DOCUSATE SODIUM 8.6-50 MG PO TABS: 2.0000 | ORAL_TABLET | Freq: Every evening | ORAL | Status: DC | PRN

## 2022-06-15 MED ORDER — BENZOCAINE-MENTHOL 20-0.5 % EX AERO
1.0000 | INHALATION_SPRAY | CUTANEOUS | 0 refills | Status: DC | PRN
  Filled 2022-06-15: qty 78, fill #0

## 2022-06-15 NOTE — Social Work (Signed)
CSW received consult for hx of marijuana use. Referral was screened out due to the following: MOB had no documented substance use during the pregnancy. Per chart review, it is noted "none during pregnancy."   CSW received consult for limited [facility].  CSW reviewed chart and is screening out consult as it does not meet criteria for automatic CSW involvement and infant drug screening.  MOB had at least 4 prenatal visits ([redacted]w[redacted]d, [redacted]w[redacted]d, [redacted]w[redacted]d, [redacted]w[redacted]d).    CSW discussed with MOB OB provider and the pediatrician.   Please contact CSW if current concerns arise or by MOB's request.   Vivi Barrack, MSW, LCSW [facility]  Clinical Social Worker  [phone] 06/15/2022  9:45 AM

## 2022-06-15 NOTE — Progress Notes (Signed)
Circumcision Consent   -Circumcision procedure details discussed including usage of local anesthesia, infant soother, and removal and disposal of foreskin. -Risks and benefits of procedure were reviewed including, but not limited to:  *Benefits include reduction in the rates of urinary tract infection (UTI), penile cancer, some sexually transmitted infections, penile inflammatory, and retractile disorders, as well as easier hygiene.   *Risks include bleeding, infection, injury of glans which may lead to need for additional surgery, penile deformity, or urinary tract issues, unsatisfactory cosmetic appearance and other potential complications related to the procedure.   -Informed that procedure will not be performed if provider deems inappropriate d/t penile size, noted deformity, or unsatisfactory pediatric evaluation. -It was emphasized that this is an elective procedure.   -Post circumcision care discussed.   Patient wants to proceed with circumcision. Informed that team would be notified and complete prior to discharge and upon satisfactory pediatric evaluation of infant.    Myrtie Hawk, DO FMOB Fellow, Faculty practice [facility], Center for [facility] @TODAY @ 8:00 AM

## 2022-06-15 NOTE — Progress Notes (Signed)
POSTPARTUM PROGRESS NOTE  Post Partum Day 1  Subjective:  Allison Long is a 31 y.o. [id number] s/p SVD at [redacted]w[redacted]d.  She reports she is doing well. No acute events overnight. She denies any problems with ambulating, voiding or po intake. Denies nausea or vomiting.  Pain is well controlled.  Lochia is adequate.  Objective: Blood pressure 100/71, pulse 81, temperature 97.6 F (36.4 C), temperature source Oral, resp. rate 17, height 5' (1.524 m), weight 82.4 kg, last menstrual period 08/31/2021, SpO2 99 %, unknown if currently breastfeeding.  Physical Exam:  General: alert, cooperative and no distress Chest: no respiratory distress Heart:regular rate, distal pulses intact Uterine Fundus: firm, appropriately tender DVT Evaluation: No calf swelling or tenderness Extremities: mild edema Skin: warm, dry  Recent Labs    06/13/22 1701  HGB 11.2*  HCT 33.6*    Assessment/Plan: TAGAN BARTRAM is a 31 y.o. F6C1275 s/p SVD at [redacted]w[redacted]d   PPD#1 - Doing well  Routine postpartum care Contraception: Depo, then BTL Feeding: Breast and bottle Dispo: Plan for discharge tomorrow.   LOS: 2 days   Myrtie Hawk, DO OB Fellow  06/15/2022, 7:59 AM

## 2022-06-20 ENCOUNTER — Inpatient Hospital Stay (HOSPITAL_COMMUNITY): Payer: Medicaid Other

## 2022-06-20 ENCOUNTER — Inpatient Hospital Stay (HOSPITAL_COMMUNITY)
Admission: AD | Admit: 2022-06-20 | Payer: Medicaid Other | Source: Home / Self Care | Admitting: Obstetrics and Gynecology

## 2022-06-25 ENCOUNTER — Other Ambulatory Visit: Payer: Self-pay

## 2022-06-25 ENCOUNTER — Telehealth (HOSPITAL_COMMUNITY): Payer: Self-pay | Admitting: *Deleted

## 2022-06-25 DIAGNOSIS — B379 Candidiasis, unspecified: Secondary | ICD-10-CM

## 2022-06-25 MED ORDER — FLUCONAZOLE 150 MG PO TABS: 150.0000 mg | ORAL_TABLET | Freq: Once | ORAL | 0 refills | Status: AC

## 2022-06-25 NOTE — Telephone Encounter (Signed)
Attempted Hospital Discharge Follow-Up Call.  Left voice mail requesting that patient return RN's phone call if patient has any concerns or questions regarding herself or her baby.  

## 2022-07-16 ENCOUNTER — Ambulatory Visit: Payer: Medicaid Other | Admitting: Obstetrics and Gynecology

## 2022-08-03 IMAGING — DX DG RIBS W/ CHEST 3+V*L*
3 series · 3 of 3 positions shown · non-contrast
Comparison: None.

CLINICAL DATA: Injury.  Left rib pain.

EXAM:
LEFT RIBS AND CHEST - 3+ VIEW

[chest pa]
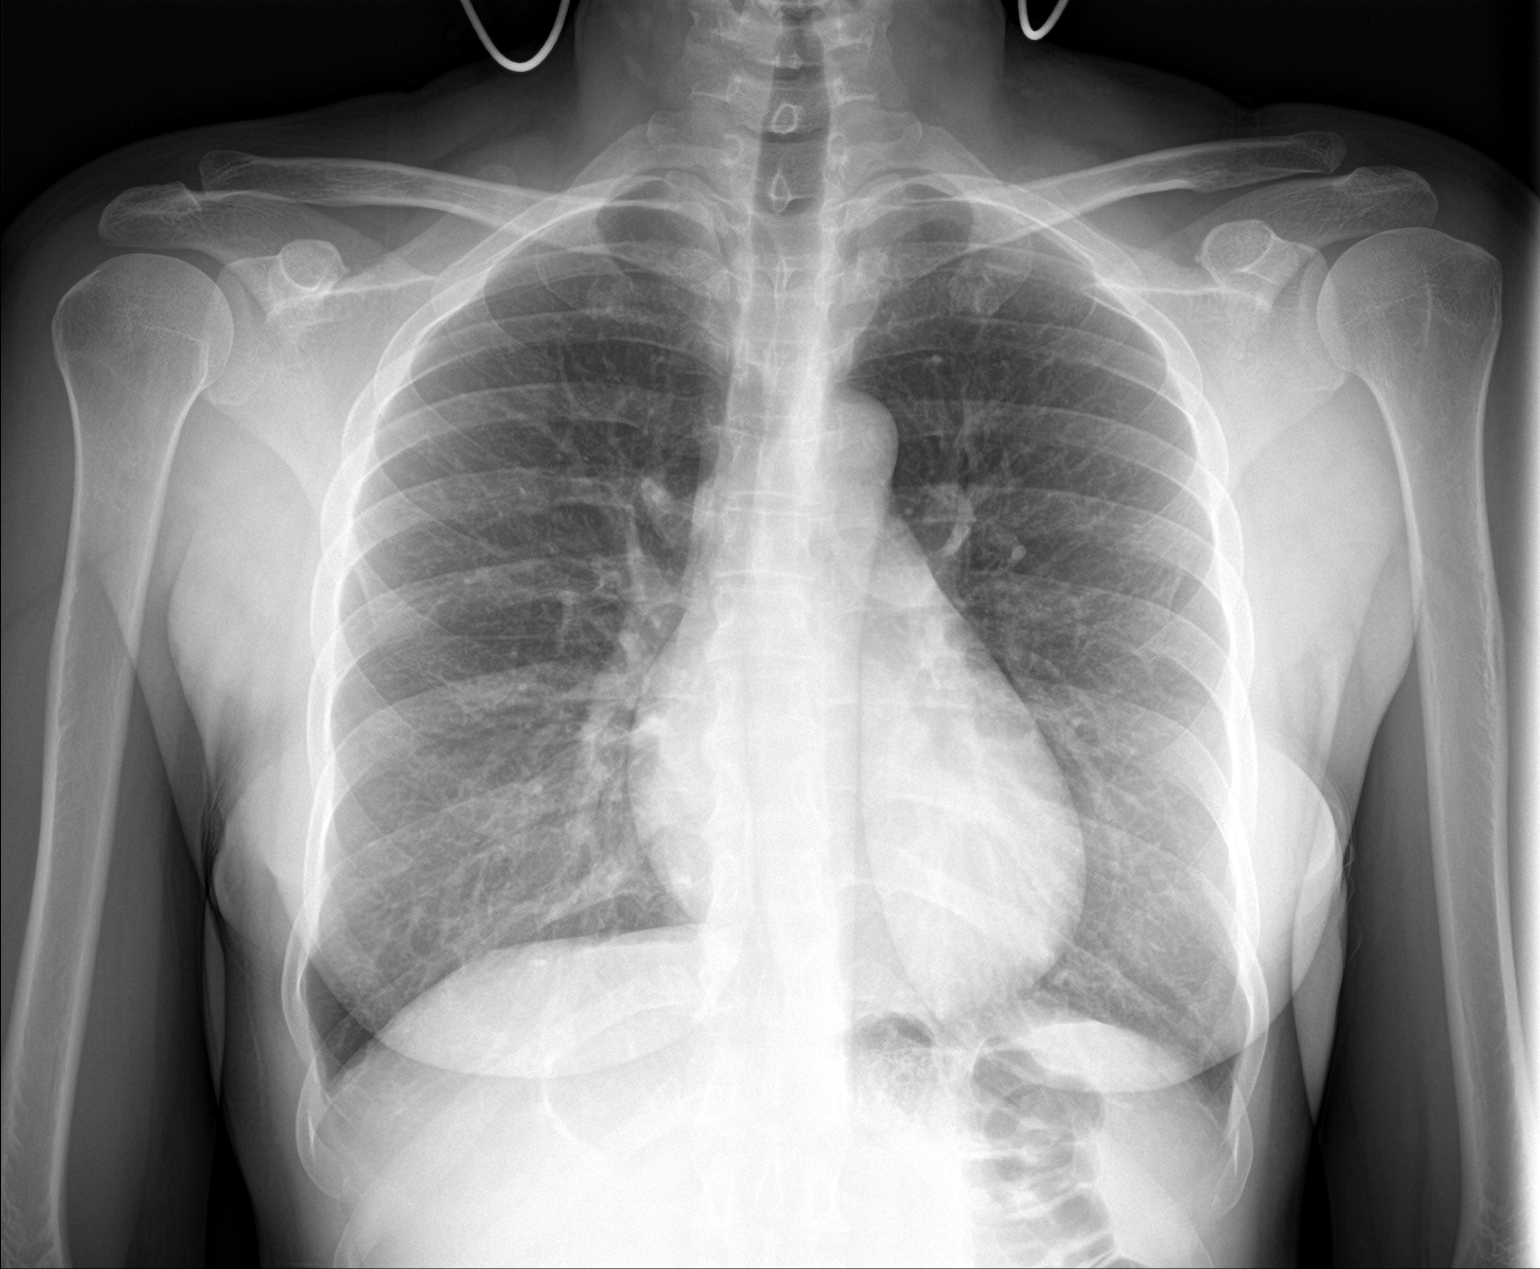

[rib obl]
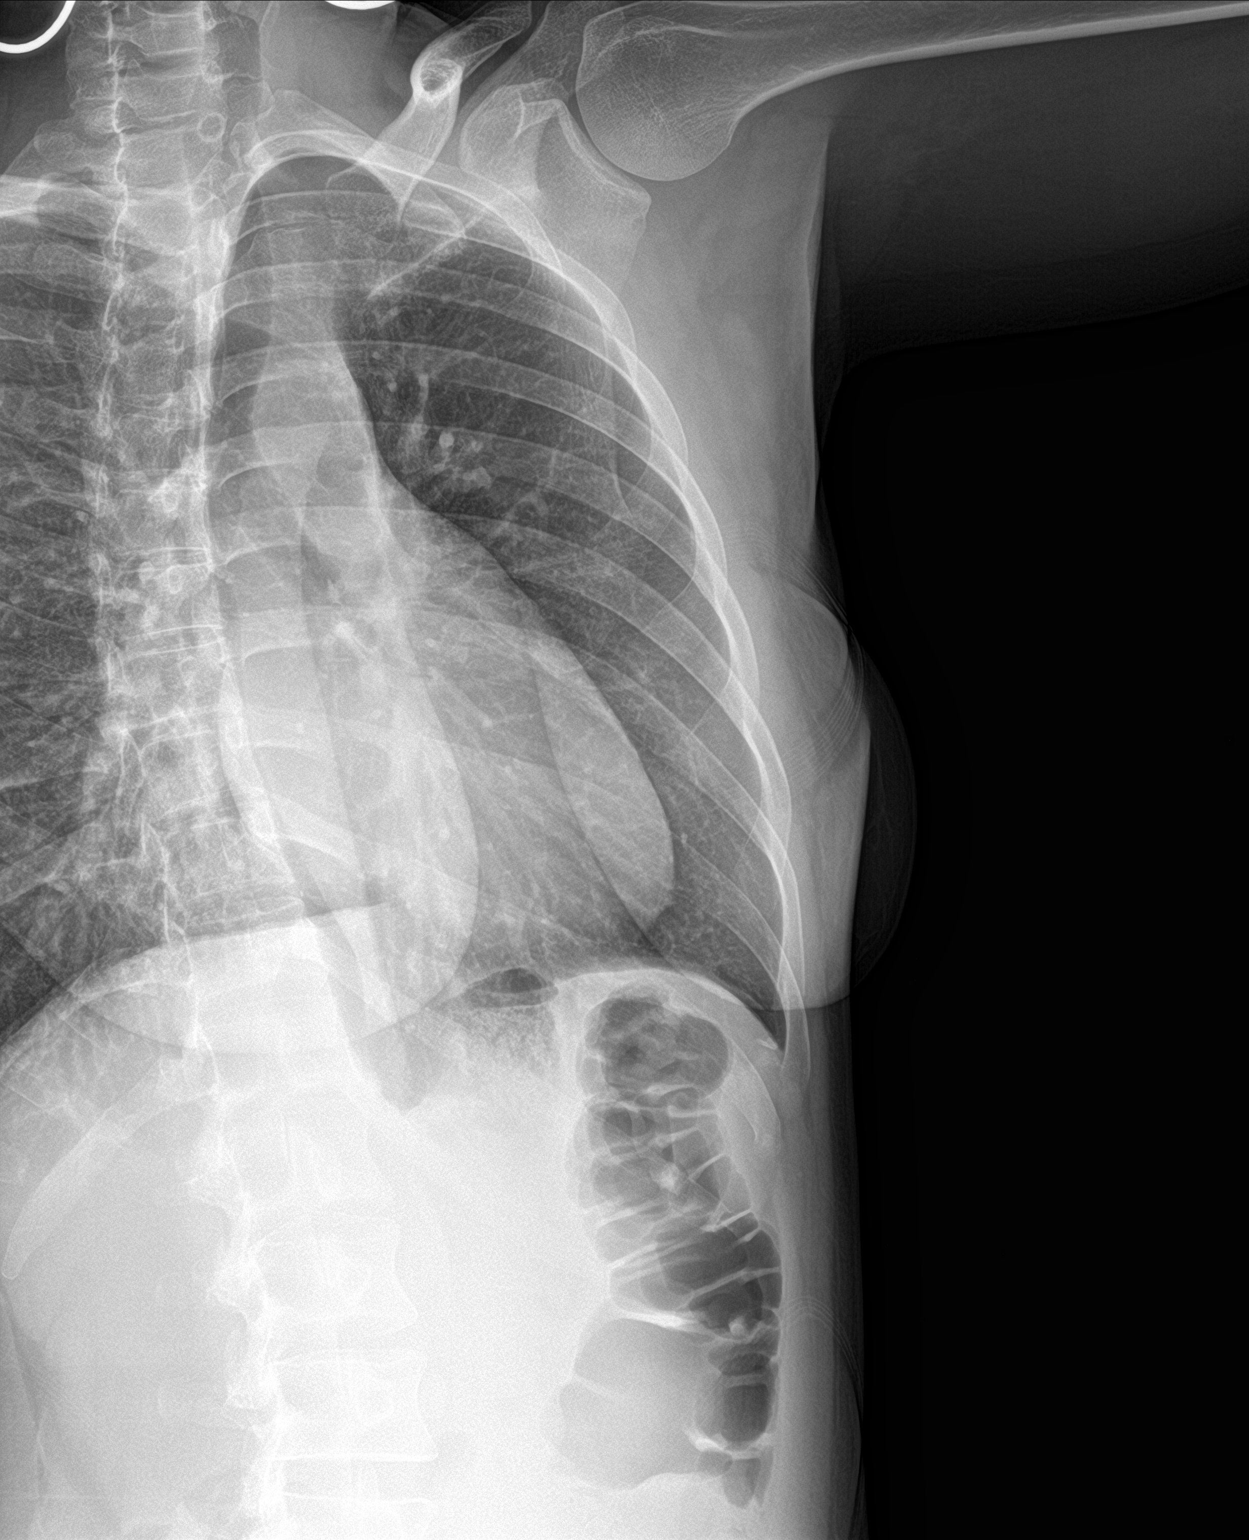

[rib pa]
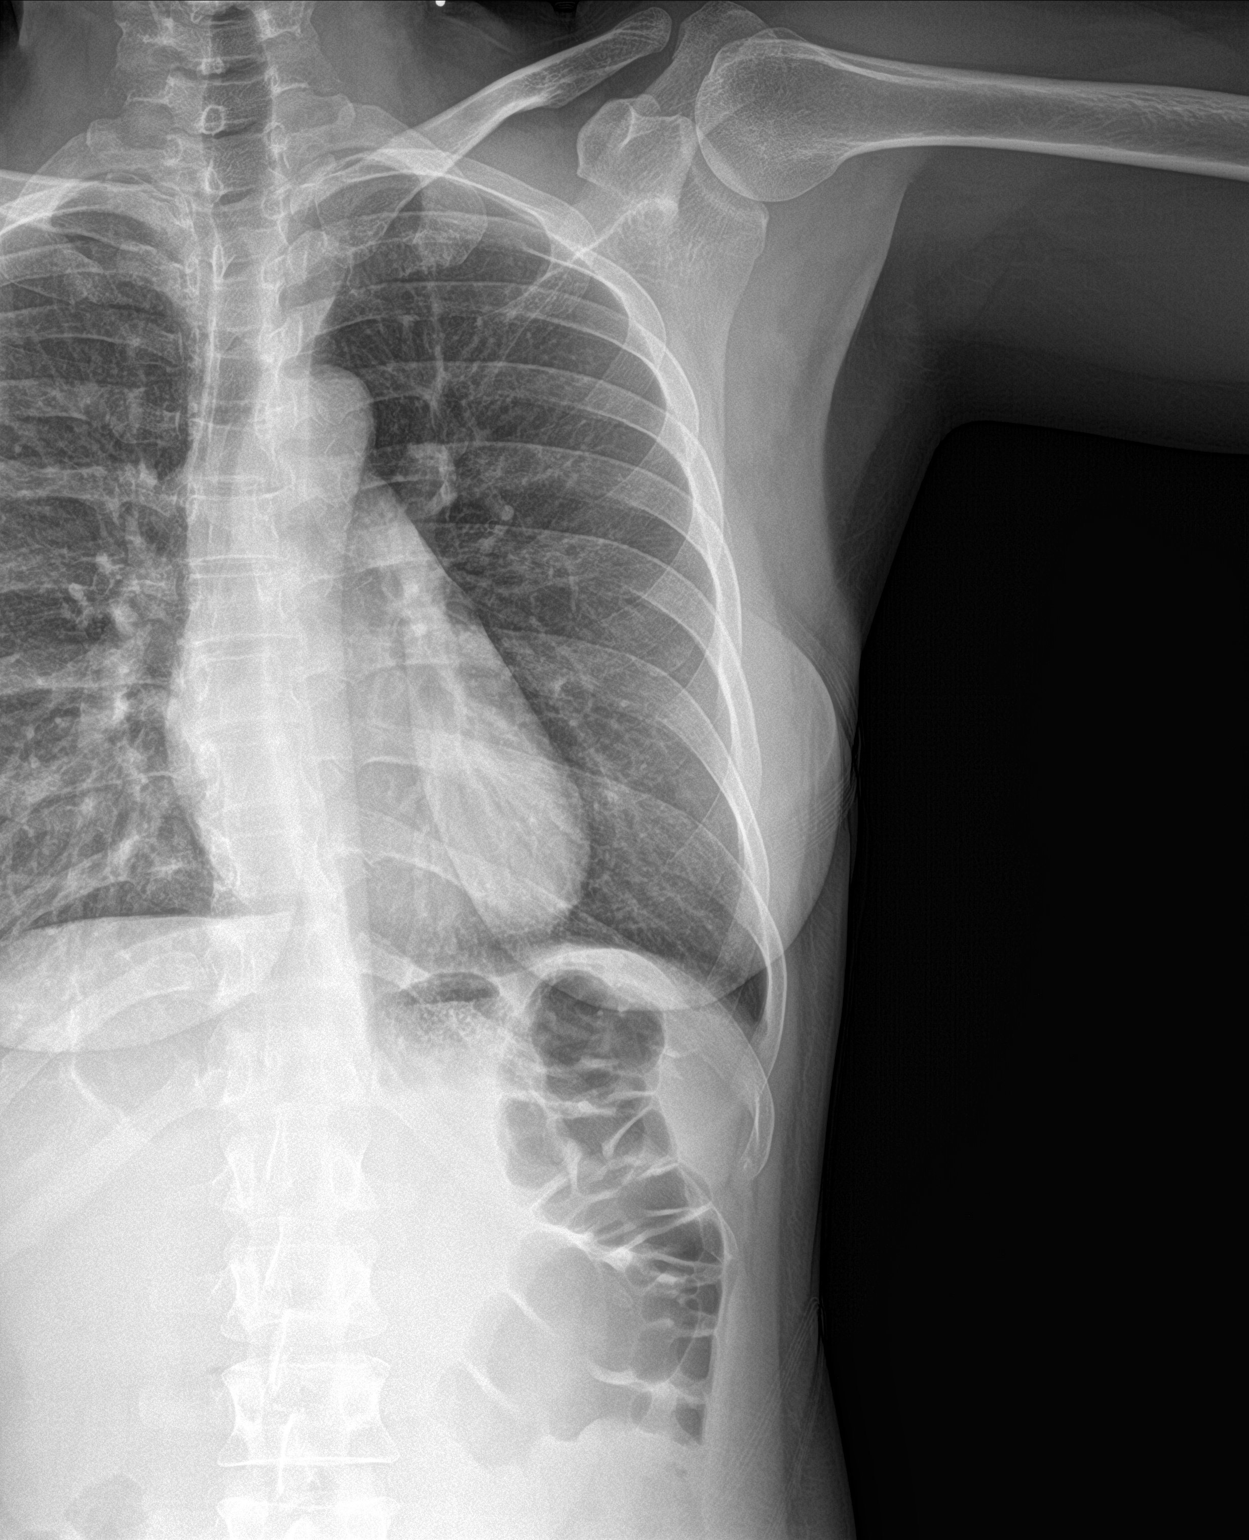

[3 of 3 positions shown; findings below may reference images not displayed]

FINDINGS: No evidence of displaced rib fracture or pneumothorax.
IMPRESSION: No acute abnormality.

## 2022-10-30 ENCOUNTER — Ambulatory Visit: Payer: Medicaid Other | Admitting: Obstetrics

## 2022-11-01 ENCOUNTER — Ambulatory Visit: Payer: Medicaid Other | Admitting: Obstetrics

## 2022-11-01 NOTE — Progress Notes (Deleted)
   Established Patient Office Visit  Subjective   Patient ID: Allison Long, female    DOB: 01-Mar-1991  Age: 32 y.o. MRN: [id number]  No chief complaint on file.   HPI  {History (Optional):23778}  ROS    Objective:     There were no vitals taken for this visit. {Vitals History (Optional):23777}  Physical Exam   No results found for any visits on 11/01/22.  {Labs (Optional):23779}  The ASCVD Risk score (Arnett DK, et al., 2019) failed to calculate for the following reasons:   The 2019 ASCVD risk score is only valid for ages 61 to 40    Assessment & Plan:   Problem List Items Addressed This Visit   None   No follow-ups on file.    Baltazar Najjar, MD

## 2022-12-31 ENCOUNTER — Ambulatory Visit (HOSPITAL_COMMUNITY)
Admission: EM | Admit: 2022-12-31 | Discharge: 2022-12-31 | Disposition: A | Discharge status: Home / Self Care | Payer: Medicaid Other | Attending: Physician Assistant | Admitting: Physician Assistant

## 2022-12-31 ENCOUNTER — Encounter (HOSPITAL_COMMUNITY): Payer: Self-pay

## 2022-12-31 DIAGNOSIS — J9801 Acute bronchospasm: Secondary | ICD-10-CM | POA: Diagnosis not present

## 2022-12-31 DIAGNOSIS — J329 Chronic sinusitis, unspecified: Secondary | ICD-10-CM | POA: Diagnosis not present

## 2022-12-31 DIAGNOSIS — J40 Bronchitis, not specified as acute or chronic: Secondary | ICD-10-CM | POA: Diagnosis not present

## 2022-12-31 MED ORDER — IPRATROPIUM-ALBUTEROL 0.5-2.5 (3) MG/3ML IN SOLN
3.0000 mL | Freq: Once | RESPIRATORY_TRACT | Status: AC
  Administered 2022-12-31: 3 mL via RESPIRATORY_TRACT

## 2022-12-31 MED ORDER — ALBUTEROL SULFATE HFA 108 (90 BASE) MCG/ACT IN AERS: 1.0000 | INHALATION_SPRAY | Freq: Four times a day (QID) | RESPIRATORY_TRACT | 0 refills | Status: DC | PRN

## 2022-12-31 MED ORDER — PREDNISONE 10 MG (21) PO TBPK: ORAL_TABLET | ORAL | 0 refills | Status: DC

## 2022-12-31 MED ORDER — AMOXICILLIN-POT CLAVULANATE 875-125 MG PO TABS: 1.0000 | ORAL_TABLET | Freq: Two times a day (BID) | ORAL | 0 refills | Status: DC

## 2022-12-31 MED ORDER — IPRATROPIUM-ALBUTEROL 0.5-2.5 (3) MG/3ML IN SOLN
RESPIRATORY_TRACT | Status: AC
  Filled 2022-12-31: qty 3

## 2022-12-31 MED ORDER — METHYLPREDNISOLONE SODIUM SUCC 125 MG IJ SOLR
80.0000 mg | Freq: Once | INTRAMUSCULAR | Status: AC
  Administered 2022-12-31: 80 mg via INTRAMUSCULAR

## 2022-12-31 MED ORDER — METHYLPREDNISOLONE SODIUM SUCC 125 MG IJ SOLR
INTRAMUSCULAR | Status: AC
  Filled 2022-12-31: qty 2

## 2022-12-31 NOTE — Discharge Instructions (Signed)
Start prednisone as prescribed.  Do not take NSAIDs with this medication including aspirin, ibuprofen/Advil, naproxen/Aleve.  Take Augmentin twice daily for 7 days.  Use albuterol every 4-6 hours for shortness of breath and coughing fits.  Use a humidifier as well as over-the-counter medications including Mucinex, Flonase, Tylenol for symptom relief.  Make sure you rest and drink plenty of fluid.  If your symptoms are improving within a few days please return for reevaluation.  If you have any worsening symptoms including high fever, worsening cough, shortness of breath, nausea/vomiting interfering with oral intake, weakness you need to be seen immediately.

## 2022-12-31 NOTE — ED Provider Notes (Signed)
[place]    CSN: [id number] Arrival date & time: 12/31/22  1042      History   Chief Complaint Chief Complaint  Patient presents with   Cough   Emesis   Diarrhea    HPI BULAR HIRAOKA is a 32 y.o. female.   Patient presents today with several week history of persistent and worsening cough.  Reports associated nasal congestion, posttussive nausea and vomiting, diarrhea, body aches.  Denies any fever, chest pain, shortness of breath she needs to be seen immediately.  She reports several people in her house who have been sick with similar symptoms.  She denies history of allergies, asthma, COPD, smoking.  She has had COVID in 2020 but has not had vaccines.  She is not breast-feeding and is confident that she is not pregnant.  Denies any recent antibiotics or steroids.  She has tried Mucinex and NyQuil without improvement of symptoms.  She is having difficulty with daily activities and has had called out of work as a result of symptoms.    Past Medical History:  Diagnosis Date   Anemia    Chlamydia    x 2    Patient Active Problem List   Diagnosis Date Noted   SVD (spontaneous vaginal delivery) 06/14/2022   Shoulder dystocia during labor and delivery 06/14/2022   Indication for care in labor or delivery 06/13/2022   Unwanted fertility 05/30/2022   Supervision of other normal pregnancy, antepartum 01/02/2022    Past Surgical History:  Procedure Laterality Date   NO PAST SURGERIES      OB History     Gravida  6   Para  5   Term  5   Preterm  0   AB  1   Living  5      SAB  1   IAB  0   Ectopic  0   Multiple  0   Live Births  5            Home Medications    Prior to Admission medications   Medication Sig Start Date End Date Taking? Authorizing Provider  acetaminophen (TYLENOL) 325 MG tablet Take 2 tablets (650 mg total) by mouth every 6 (six) hours as needed (for pain scale < 4). 06/15/22  Yes Salvadore Oxford, MD  albuterol  (VENTOLIN HFA) 108 (90 Base) MCG/ACT inhaler Inhale 1-2 puffs into the lungs every 6 (six) hours as needed for wheezing or shortness of breath. 12/31/22  Yes Liberty Stead K, PA-C  amoxicillin-clavulanate (AUGMENTIN) 875-125 MG tablet Take 1 tablet by mouth every 12 (twelve) hours. 12/31/22  Yes Saanya Zieske K, PA-C  ibuprofen (ADVIL) 600 MG tablet Take 1 tablet (600 mg total) by mouth every 6 (six) hours. 06/15/22  Yes Salvadore Oxford, MD  predniSONE (STERAPRED UNI-PAK 21 TAB) 10 MG (21) TBPK tablet As directed 12/31/22  Yes Camauri Craton, Derry Skill, PA-C    Family History Family History  Problem Relation Age of Onset   Thyroid disease Mother    Cancer Father        colon    Social History Social History   Tobacco Use   Smoking status: Former    Packs/day: .5    Types: Cigarettes   Smokeless tobacco: Never   Tobacco comments:    not during pregnancy  Vaping Use   Vaping Use: Never used  Substance Use Topics   Alcohol use: Not Currently    Comment: occasional   Drug use: Not  Currently    Frequency: 7.0 times per week    Types: Marijuana    Comment: none during pregnancy     Allergies   Patient has no known allergies.   Review of Systems Review of Systems  Constitutional:  Positive for activity change. Negative for appetite change, fatigue and fever.  HENT:  Positive for congestion and sore throat. Negative for sinus pressure and sneezing.   Respiratory:  Positive for cough. Negative for shortness of breath.   Cardiovascular:  Negative for chest pain.  Gastrointestinal:  Positive for diarrhea, nausea and vomiting. Negative for abdominal pain.  Musculoskeletal:  Positive for arthralgias and myalgias.  Neurological:  Negative for dizziness, light-headedness and headaches.     Physical Exam Triage Vital Signs ED Triage Vitals  Enc Vitals Group     BP 12/31/22 1139 121/85     Pulse Rate 12/31/22 1139 76     Resp 12/31/22 1139 16     Temp 12/31/22 1139 98.6 F (37 C)     Temp  Source 12/31/22 1139 Oral     SpO2 12/31/22 1139 96 %     Weight --      Height --      Head Circumference --      Peak Flow --      Pain Score 12/31/22 1137 0     Pain Loc --      Pain Edu? --      Excl. in [place]? --    No data found.  Updated Vital Signs BP 121/85 (BP Location: Left Arm)   Pulse 76   Temp 98.6 F (37 C) (Oral)   Resp 16   LMP 12/28/2022   SpO2 96%   Visual Acuity Right Eye Distance:   Left Eye Distance:   Bilateral Distance:    Right Eye Near:   Left Eye Near:    Bilateral Near:     Physical Exam Vitals reviewed.  Constitutional:      General: She is awake. She is not in acute distress.    Appearance: Normal appearance. She is well-developed. She is not ill-appearing.     Comments: Very pleasant female appears stated age in no acute distress sitting comfortably in exam room  HENT:     Head: Normocephalic and atraumatic.     Right Ear: Tympanic membrane, ear canal and external ear normal. Tympanic membrane is not erythematous or bulging.     Left Ear: Tympanic membrane, ear canal and external ear normal. Tympanic membrane is not erythematous or bulging.     Nose:     Right Sinus: Maxillary sinus tenderness present. No frontal sinus tenderness.     Left Sinus: Maxillary sinus tenderness present. No frontal sinus tenderness.     Mouth/Throat:     Pharynx: Uvula midline. No oropharyngeal exudate or posterior oropharyngeal erythema.  Cardiovascular:     Rate and Rhythm: Normal rate and regular rhythm.     Heart sounds: No murmur heard. Pulmonary:     Effort: Pulmonary effort is normal.     Breath sounds: Wheezing present. No rhonchi or rales.     Comments: Widespread wheezing Abdominal:     General: Bowel sounds are normal.     Palpations: Abdomen is soft.     Tenderness: There is no right CVA tenderness, left CVA tenderness, guarding or rebound.  Musculoskeletal:     Right lower leg: No edema.     Left lower leg: No edema.  Psychiatric:  Behavior: Behavior is cooperative.      UC Treatments / Results  Labs (all labs ordered are listed, but only abnormal results are displayed) Labs Reviewed - No data to display  EKG   Radiology No results found.  Procedures Procedures (including critical care time)  Medications Ordered in UC Medications  ipratropium-albuterol (DUONEB) 0.5-2.5 (3) MG/3ML nebulizer solution 3 mL (3 mLs Nebulization Given 12/31/22 1238)  methylPREDNISolone sodium succinate (SOLU-MEDROL) 125 mg/2 mL injection 80 mg (80 mg Intramuscular Given 12/31/22 1252)    Initial Impression / Assessment and Plan / UC Course  I have reviewed the triage vital signs and the nursing notes.  Pertinent labs & imaging results that were available during my care of the patient were reviewed by me and considered in my medical decision making (see chart for details).     Patient is well-appearing, afebrile, nontoxic, nontachycardic, with oxygen saturation of 96%.  Viral testing was deferred as she has been symptomatic for several weeks.  She was given Solu-Medrol and DuoNeb in clinic with improvement but not resolution of symptoms.  Given prolonged and worsening symptoms will cover for secondary bacterial infection with Augmentin twice daily for 7 days.  She was also started prednisone taper with instruction not to take NSAIDs with this medication due to risk of GI bleeding.  She can use Tylenol, Mucinex, Flonase for additional symptom relief.  Albuterol inhaler was sent to pharmacy with instruction how to use this during clinic visit today.  Discussed that if her symptoms or not improving within a few days she needs to return for reevaluation.  If she has any worsening symptoms including high fever, cough, shortness of breath, nausea/vomiting interfering with oral intake, weakness she needs to be seen immediately.  Strict return precautions given.  Work excuse note provided.  Final Clinical Impressions(s) / UC Diagnoses    Final diagnoses:  Sinobronchitis  Bronchospasm     Discharge Instructions      Start prednisone as prescribed.  Do not take NSAIDs with this medication including aspirin, ibuprofen/Advil, naproxen/Aleve.  Take Augmentin twice daily for 7 days.  Use albuterol every 4-6 hours for shortness of breath and coughing fits.  Use a humidifier as well as over-the-counter medications including Mucinex, Flonase, Tylenol for symptom relief.  Make sure you rest and drink plenty of fluid.  If your symptoms are improving within a few days please return for reevaluation.  If you have any worsening symptoms including high fever, worsening cough, shortness of breath, nausea/vomiting interfering with oral intake, weakness you need to be seen immediately.     ED Prescriptions     Medication Sig Dispense Auth. Provider   predniSONE (STERAPRED UNI-PAK 21 TAB) 10 MG (21) TBPK tablet As directed 21 tablet Haruo Stepanek K, PA-C   albuterol (VENTOLIN HFA) 108 (90 Base) MCG/ACT inhaler Inhale 1-2 puffs into the lungs every 6 (six) hours as needed for wheezing or shortness of breath. 18 g Hiroko Tregre K, PA-C   amoxicillin-clavulanate (AUGMENTIN) 875-125 MG tablet Take 1 tablet by mouth every 12 (twelve) hours. 14 tablet Azariel Banik, Derry Skill, PA-C      PDMP not reviewed this encounter.   Terrilee Croak, PA-C 12/31/22 1319

## 2022-12-31 NOTE — ED Triage Notes (Signed)
Pt is here for a cough x 66month, vomiting and diarrhea x 3wks

## 2023-01-08 ENCOUNTER — Other Ambulatory Visit (HOSPITAL_COMMUNITY)
Admission: RE | Admit: 2023-01-08 | Discharge: 2023-01-08 | Disposition: A | Discharge status: Home / Self Care | Payer: Medicaid Other | Source: Ambulatory Visit | Attending: Obstetrics | Admitting: Obstetrics

## 2023-01-08 ENCOUNTER — Ambulatory Visit: Payer: Medicaid Other | Admitting: Obstetrics

## 2023-01-08 ENCOUNTER — Encounter: Payer: Self-pay | Admitting: Obstetrics

## 2023-01-08 VITALS — BP 122/85 | HR 82 | Ht 60.0 in | Wt 190.2 lb

## 2023-01-08 DIAGNOSIS — N926 Irregular menstruation, unspecified: Secondary | ICD-10-CM | POA: Insufficient documentation

## 2023-01-08 DIAGNOSIS — N898 Other specified noninflammatory disorders of vagina: Secondary | ICD-10-CM | POA: Diagnosis not present

## 2023-01-08 DIAGNOSIS — Z3009 Encounter for other general counseling and advice on contraception: Secondary | ICD-10-CM | POA: Diagnosis not present

## 2023-01-08 DIAGNOSIS — E669 Obesity, unspecified: Secondary | ICD-10-CM

## 2023-01-08 DIAGNOSIS — Z01419 Encounter for gynecological examination (general) (routine) without abnormal findings: Secondary | ICD-10-CM | POA: Diagnosis not present

## 2023-01-08 DIAGNOSIS — J301 Allergic rhinitis due to pollen: Secondary | ICD-10-CM

## 2023-01-08 LAB — POCT PREGNANCY, URINE: Preg Test, Ur: NEGATIVE

## 2023-01-08 MED ORDER — LORATADINE 10 MG PO TABS: 10.0000 mg | ORAL_TABLET | Freq: Every day | ORAL | 11 refills | Status: DC

## 2023-01-08 NOTE — Progress Notes (Signed)
Pt presents to discuss post coital spotting and cramping.  Also reports irregular periods.  Last Depo August 2023

## 2023-01-08 NOTE — Progress Notes (Signed)
Subjective:        Allison Long is a 32 y.o. female here for a routine exam.  Current complaints:  Vaginal discharge and irregular periods.  Had a NSVD 7 months ago, and got Depo before discharge.  Periods have been irregular since delivery .  Personal health questionnaire:  Is patient Ashkenazi Jewish, have a family history of breast and/or ovarian cancer: no Is there a family history of uterine cancer diagnosed at age < 40, gastrointestinal cancer, urinary tract cancer, family member who is a Field seismologist syndrome-associated carrier: no Is the patient overweight and hypertensive, family history of diabetes, personal history of gestational diabetes, preeclampsia or PCOS: no Is patient over 56, have PCOS,  family history of premature CHD under age 3, diabetes, smoke, have hypertension or peripheral artery disease:  no At any time, has a partner hit, kicked or otherwise hurt or frightened you?: no Over the past 2 weeks, have you felt down, depressed or hopeless?: no Over the past 2 weeks, have you felt little interest or pleasure in doing things?:no   Gynecologic History Patient's last menstrual period was 12/28/2022 (exact date). Contraception: none Last Pap: 2023. Results were: normal Last mammogram: n/a. Results were: n/a  Obstetric History OB History  Gravida Para Term Preterm AB Living  6 5 5  0 1 5  SAB IAB Ectopic Multiple Live Births  1 0 0 0 5    # Outcome Date GA Lbr Len/2nd Weight Sex Delivery Anes PTL Lv  6 Term 06/14/22 [redacted]w[redacted]d 13:04 / 00:15 7 lb 7.2 oz (3.38 kg) M Vag-Spont EPI  LIV  5 Term 01/11/19 [redacted]w[redacted]d 13:31 / 02:01 7 lb 4.1 oz (3.29 kg) M Vag-Spont EPI  LIV  4 SAB 2018          3 Term 09/06/16 [redacted]w[redacted]d 10:01 / 00:12 6 lb 15.6 oz (3.165 kg) F Vag-Spont EPI  LIV  2 Term 08/19/14 [redacted]w[redacted]d 05:50 / 00:17 6 lb 4.7 oz (2.855 kg) F Vag-Spont EPI  LIV  1 Term 09/28/11 [redacted]w[redacted]d 14:57 / 01:13 6 lb 7 oz (2.92 kg) M Vag-Spont EPI  LIV     Birth Comments: extra digit beside fifth finger  on left hand.  birthmark under right nipple.    Past Medical History:  Diagnosis Date   Anemia    Chlamydia    x 2    Past Surgical History:  Procedure Laterality Date   NO PAST SURGERIES       Current Outpatient Medications:    albuterol (VENTOLIN HFA) 108 (90 Base) MCG/ACT inhaler, Inhale 1-2 puffs into the lungs every 6 (six) hours as needed for wheezing or shortness of breath., Disp: 18 g, Rfl: 0   amoxicillin-clavulanate (AUGMENTIN) 875-125 MG tablet, Take 1 tablet by mouth every 12 (twelve) hours., Disp: 14 tablet, Rfl: 0   loratadine (CLARITIN) 10 MG tablet, Take 1 tablet (10 mg total) by mouth daily., Disp: 30 tablet, Rfl: 11   predniSONE (STERAPRED UNI-PAK 21 TAB) 10 MG (21) TBPK tablet, As directed, Disp: 21 tablet, Rfl: 0   acetaminophen (TYLENOL) 325 MG tablet, Take 2 tablets (650 mg total) by mouth every 6 (six) hours as needed (for pain scale < 4). (Patient not taking: Reported on 01/08/2023), Disp: , Rfl:    ibuprofen (ADVIL) 600 MG tablet, Take 1 tablet (600 mg total) by mouth every 6 (six) hours. (Patient not taking: Reported on 01/08/2023), Disp: 30 tablet, Rfl: 0 No Known Allergies  Social History   Tobacco Use  Smoking status: Former    Packs/day: .5    Types: Cigarettes   Smokeless tobacco: Never   Tobacco comments:    not during pregnancy  Substance Use Topics   Alcohol use: Not Currently    Comment: occasional    Family History  Problem Relation Age of Onset   Thyroid disease Mother    Cancer Father        colon      Review of Systems  Constitutional: negative for fatigue and weight loss Respiratory: negative for cough and wheezing Cardiovascular: negative for chest pain, fatigue and palpitations Gastrointestinal: negative for abdominal pain and change in bowel habits Musculoskeletal:negative for myalgias Neurological: negative for gait problems and tremors Behavioral/Psych: negative for abusive relationship, depression Endocrine: negative for  temperature intolerance    Genitourinary: positive for vaginal discharge.  negative for abnormal menstrual periods, genital lesions, hot flashes, sexual problems  Integument/breast: negative for breast lump, breast tenderness, nipple discharge and skin lesion(s)    Objective:       BP 122/85   Pulse 82   Ht 5' (1.524 m)   Wt 190 lb 3.2 oz (86.3 kg)   LMP 12/28/2022 (Exact Date)   Breastfeeding No   BMI 37.15 kg/m  General:   Alert and no distress  Skin:   no rash or abnormalities  Lungs:   clear to auscultation bilaterally  Heart:   regular rate and rhythm, S1, S2 normal, no murmur, click, rub or gallop  Breasts:   normal without suspicious masses, skin or nipple changes or axillary nodes  Abdomen:  normal findings: no organomegaly, soft, non-tender and no hernia  Pelvis:  External genitalia: normal general appearance Urinary system: urethral meatus normal and bladder without fullness, nontender Vaginal: normal without tenderness, induration or masses Cervix: normal appearance Adnexa: normal bimanual exam Uterus: anteverted and non-tender, normal size   Lab Review Urine pregnancy test Labs reviewed yes Radiologic studies reviewed no  I have spent a total of 20 minutes of face-to-face time, excluding clinical staff time, reviewing notes and preparing to see patient, ordering tests and/or medications, and counseling   Assessment:     1. Encounter for gynecological examination with Papanicolaou smear of cervix Rx: - Cytology - PAP( [facility])  2. Irregular periods  3. Vaginal discharge Rx - Cervicovaginal ancillary only( [facility])  4. Encounter for other general counseling and advice on contraception Rx: - POCT Pregnancy, Urine  5. Seasonal allergic rhinitis due to pollen Rx: - loratadine (CLARITIN) 10 MG tablet; Take 1 tablet (10 mg total) by mouth daily.  Dispense: 30 tablet; Refill: 11  6. Obesity (BMI 35.0-39.9 without comorbidity)     Plan:     Education reviewed: calcium supplements, depression evaluation, low fat, low cholesterol diet, safe sex/STD prevention, self breast exams, and weight bearing exercise. Contraception: IUD. Follow up in: 2 weeks.   Meds ordered this encounter  Medications   loratadine (CLARITIN) 10 MG tablet    Sig: Take 1 tablet (10 mg total) by mouth daily.    Dispense:  30 tablet    Refill:  11     Shelly Bombard, MD 01/08/2023 9:03 AM

## 2023-01-09 LAB — CERVICOVAGINAL ANCILLARY ONLY
Bacterial Vaginitis (gardnerella): NEGATIVE
Candida Glabrata: NEGATIVE
Candida Vaginitis: NEGATIVE
Chlamydia: NEGATIVE
Comment: NEGATIVE
Comment: NEGATIVE
Comment: NEGATIVE
Comment: NEGATIVE
Comment: NEGATIVE
Comment: NORMAL
Neisseria Gonorrhea: NEGATIVE
Trichomonas: NEGATIVE

## 2023-01-09 LAB — CYTOLOGY - PAP
Comment: NEGATIVE
Diagnosis: NEGATIVE
High risk HPV: NEGATIVE

## 2023-01-14 ENCOUNTER — Encounter: Payer: Self-pay | Admitting: Obstetrics

## 2023-01-23 ENCOUNTER — Ambulatory Visit: Payer: Medicaid Other

## 2023-06-21 ENCOUNTER — Ambulatory Visit: Payer: Medicaid Other

## 2023-11-09 ENCOUNTER — Encounter (HOSPITAL_COMMUNITY): Payer: Self-pay

## 2023-11-09 ENCOUNTER — Ambulatory Visit (HOSPITAL_COMMUNITY)
Admission: EM | Admit: 2023-11-09 | Discharge: 2023-11-09 | Disposition: A | Discharge status: Home / Self Care | Payer: Medicaid Other | Attending: Physician Assistant | Admitting: Physician Assistant

## 2023-11-09 DIAGNOSIS — J0100 Acute maxillary sinusitis, unspecified: Secondary | ICD-10-CM

## 2023-11-09 DIAGNOSIS — H109 Unspecified conjunctivitis: Secondary | ICD-10-CM | POA: Diagnosis not present

## 2023-11-09 MED ORDER — GENTAMICIN SULFATE 0.3 % OP SOLN: 1.0000 [drp] | Freq: Four times a day (QID) | OPHTHALMIC | 0 refills | Status: AC

## 2023-11-09 MED ORDER — AMOXICILLIN-POT CLAVULANATE 875-125 MG PO TABS: 1.0000 | ORAL_TABLET | Freq: Two times a day (BID) | ORAL | 0 refills | Status: AC

## 2023-11-09 NOTE — Discharge Instructions (Signed)
Please take the medications as prescribed.  You may use [employer] baby shampoo to help wash around your eyes.  Warm compresses may also help.  I would also recommend using a humidifier in your room at night.  You may use nasal saline several times throughout the day to help with nasal congestion. Feel better soon!

## 2023-11-09 NOTE — ED Provider Notes (Signed)
[facility]   MRN: [id number] DOB: 06-Oct-1991  Subjective:   Allison Long is a 33 y.o. female presenting for bilateral eye crusting and drainage for the last week.  Patient also reports cough and sinus congestion productive of yellow mucus.  She reports sinus pressure.  Her younger son was diagnosed with RSV last week and he is doing better.  Patient reports that she had some cough as well.  She denies any fever or chills.  She denies any GI symptoms.  She has been using Mucinex and DayQuil at home without relief.  Patient is no longer breast-feeding.  She reports no chance of pregnancy at this time.  No current facility-administered medications for this encounter.  Current Outpatient Medications:    amoxicillin-clavulanate (AUGMENTIN) 875-125 MG tablet, Take 1 tablet by mouth every 12 (twelve) hours. Take with food., Disp: 14 tablet, Rfl: 0   gentamicin (GARAMYCIN) 0.3 % ophthalmic solution, Place 1 drop into both eyes 4 (four) times daily for 5 days., Disp: 5 mL, Rfl: 0   No Known Allergies  Past Medical History:  Diagnosis Date   Anemia    Chlamydia    x 2     Past Surgical History:  Procedure Laterality Date   NO PAST SURGERIES      Family History  Problem Relation Age of Onset   Thyroid disease Mother    Cancer Father        colon    Social History   Tobacco Use   Smoking status: Former    Current packs/day: 0.50    Types: Cigarettes   Smokeless tobacco: Never   Tobacco comments:    not during pregnancy  Vaping Use   Vaping status: Never Used  Substance Use Topics   Alcohol use: Not Currently    Comment: occasional   Drug use: Not Currently    Frequency: 7.0 times per week    Types: Marijuana    Comment: none during pregnancy    ROS REFER TO HPI FOR PERTINENT POSITIVES AND NEGATIVES   Objective:   Vitals: BP 106/72 (BP Location: Left Arm)   Pulse 87   Temp 98.2 F (36.8 C) (Oral)   Resp 14   LMP 10/14/2023 (Approximate)    SpO2 97%   Breastfeeding Yes   Physical Exam Vitals and nursing note reviewed.  Constitutional:      General: She is not in acute distress.    Appearance: Normal appearance. She is not ill-appearing.  HENT:     Head: Normocephalic.     Right Ear: Tympanic membrane, ear canal and external ear normal.     Left Ear: Tympanic membrane, ear canal and external ear normal.     Nose: Congestion present.     Right Sinus: Maxillary sinus tenderness present.     Left Sinus: Maxillary sinus tenderness present.     Mouth/Throat:     Mouth: Mucous membranes are moist.     Pharynx: No oropharyngeal exudate or posterior oropharyngeal erythema.  Eyes:     General:        Right eye: Discharge (clear / yellow +crusting) present.        Left eye: Discharge (clear / yellow +crusting) present.    Extraocular Movements: Extraocular movements intact.     Conjunctiva/sclera: Conjunctivae normal.     Pupils: Pupils are equal, round, and reactive to light.  Cardiovascular:     Rate and Rhythm: Normal rate and regular rhythm.  Pulses: Normal pulses.     Heart sounds: Normal heart sounds. No murmur heard. Pulmonary:     Effort: Pulmonary effort is normal. No respiratory distress.     Breath sounds: Normal breath sounds. No wheezing.  Musculoskeletal:     Cervical back: Normal range of motion.  Skin:    General: Skin is warm.  Neurological:     Mental Status: She is alert and oriented to person, place, and time.  Psychiatric:        Mood and Affect: Mood normal.        Behavior: Behavior normal.       No results found for this or any previous visit (from the past 24 hours).  Assessment and Plan :   PDMP not reviewed this encounter.  1. Acute maxillary sinusitis, recurrence not specified   2. Conjunctivitis of both eyes, unspecified conjunctivitis type    Symptoms >7 days without improvement of conservative treatment at home. At this time, will treat with Augmentin po and Gentamicin drops.  Cautioned on antibiotic use and possible side effects. Advised nasal saline, humidifier, and pushing fluids. Warm compresses and baby shampoo rinses. RTC precautions advised.    AllwardtCrist Infante, PA-C 11/09/23 [id number]

## 2023-11-09 NOTE — ED Triage Notes (Signed)
Cough,congestion,eye drainage and sore throat x 1 week. Patient is coughing white mucous.

## 2023-12-24 DIAGNOSIS — H5213 Myopia, bilateral: Secondary | ICD-10-CM | POA: Diagnosis not present
